# Patient Record
Sex: Male | Born: 1965 | Race: White | Hispanic: No | Marital: Married | Smoking: Never smoker
Health system: Southern US, Community
[De-identification: ages and names within clinical notes are randomized; demographics above are authoritative.]

## PROBLEM LIST (undated history)

## (undated) DIAGNOSIS — Q549 Hypospadias, unspecified: Secondary | ICD-10-CM

## (undated) DIAGNOSIS — J302 Other seasonal allergic rhinitis: Secondary | ICD-10-CM

## (undated) DIAGNOSIS — Z86711 Personal history of pulmonary embolism: Secondary | ICD-10-CM

## (undated) DIAGNOSIS — D649 Anemia, unspecified: Secondary | ICD-10-CM

## (undated) DIAGNOSIS — T8859XA Other complications of anesthesia, initial encounter: Secondary | ICD-10-CM

## (undated) DIAGNOSIS — K219 Gastro-esophageal reflux disease without esophagitis: Secondary | ICD-10-CM

## (undated) DIAGNOSIS — I10 Essential (primary) hypertension: Secondary | ICD-10-CM

## (undated) DIAGNOSIS — G43109 Migraine with aura, not intractable, without status migrainosus: Secondary | ICD-10-CM

## (undated) DIAGNOSIS — E119 Type 2 diabetes mellitus without complications: Secondary | ICD-10-CM

## (undated) DIAGNOSIS — M1711 Unilateral primary osteoarthritis, right knee: Secondary | ICD-10-CM

## (undated) DIAGNOSIS — Z87442 Personal history of urinary calculi: Secondary | ICD-10-CM

## (undated) DIAGNOSIS — IMO0002 Reserved for concepts with insufficient information to code with codable children: Secondary | ICD-10-CM

## (undated) DIAGNOSIS — E785 Hyperlipidemia, unspecified: Secondary | ICD-10-CM

## (undated) HISTORY — DX: Type 2 diabetes mellitus without complications: E11.9

## (undated) HISTORY — PX: OTHER SURGICAL HISTORY: SHX169

## (undated) HISTORY — PX: ORIF ELBOW FRACTURE: SUR928

## (undated) HISTORY — DX: Hyperlipidemia, unspecified: E78.5

## (undated) HISTORY — PX: HYPOSPADIAS CORRECTION: SHX483

## (undated) HISTORY — DX: Essential (primary) hypertension: I10

---

## 2000-08-24 ENCOUNTER — Ambulatory Visit (HOSPITAL_COMMUNITY): Admission: RE | Admit: 2000-08-24 | Discharge: 2000-08-24 | Payer: Self-pay | Admitting: Orthopedic Surgery

## 2000-08-24 ENCOUNTER — Encounter: Payer: Self-pay | Admitting: Orthopedic Surgery

## 2001-08-16 ENCOUNTER — Ambulatory Visit (HOSPITAL_COMMUNITY): Admission: RE | Admit: 2001-08-16 | Discharge: 2001-08-16 | Payer: Self-pay | Admitting: Orthopedic Surgery

## 2001-08-16 ENCOUNTER — Encounter: Payer: Self-pay | Admitting: Orthopedic Surgery

## 2002-01-06 HISTORY — PX: CERVICAL FUSION: SHX112

## 2002-08-26 ENCOUNTER — Inpatient Hospital Stay (HOSPITAL_COMMUNITY): Admission: RE | Admit: 2002-08-26 | Discharge: 2002-08-27 | Payer: Self-pay | Admitting: Neurosurgery

## 2002-08-26 ENCOUNTER — Encounter: Payer: Self-pay | Admitting: Neurosurgery

## 2003-02-25 ENCOUNTER — Emergency Department (HOSPITAL_COMMUNITY): Admission: EM | Admit: 2003-02-25 | Discharge: 2003-02-25 | Payer: Self-pay | Admitting: Emergency Medicine

## 2004-10-16 ENCOUNTER — Emergency Department: Payer: Self-pay | Admitting: Emergency Medicine

## 2005-04-27 ENCOUNTER — Other Ambulatory Visit: Payer: Self-pay

## 2005-04-27 ENCOUNTER — Emergency Department: Payer: Self-pay | Admitting: Internal Medicine

## 2006-05-27 ENCOUNTER — Emergency Department: Payer: Self-pay | Admitting: Emergency Medicine

## 2006-06-09 ENCOUNTER — Observation Stay (HOSPITAL_COMMUNITY): Admission: AD | Admit: 2006-06-09 | Discharge: 2006-06-10 | Payer: Self-pay | Admitting: Urology

## 2006-06-09 ENCOUNTER — Other Ambulatory Visit: Payer: Self-pay | Admitting: Urology

## 2006-06-24 ENCOUNTER — Inpatient Hospital Stay (HOSPITAL_COMMUNITY): Admission: EM | Admit: 2006-06-24 | Discharge: 2006-06-30 | Payer: Self-pay | Admitting: Emergency Medicine

## 2007-02-07 ENCOUNTER — Emergency Department (HOSPITAL_COMMUNITY): Admission: EM | Admit: 2007-02-07 | Discharge: 2007-02-07 | Payer: Self-pay | Admitting: Emergency Medicine

## 2007-09-16 ENCOUNTER — Ambulatory Visit (HOSPITAL_COMMUNITY): Admission: RE | Admit: 2007-09-16 | Discharge: 2007-09-16 | Payer: Self-pay | Admitting: Urology

## 2008-09-13 IMAGING — CT CT ANGIO CHEST
2 of 4 series · 19 of 36 positions shown · IV contrast (APPLIED)
Comparison: Chest x-ray of the same day.

CLINICAL DATA: Low back pain, shortness of breath, right chest pain.  Evaluate for pulmonary embolus. 
CT ANGIOGRAPHY OF CHEST:
TECHNIQUE: Multidetector CT imaging of the chest was performed during bolus injection of intravenous contrast.  Multiplanar CT angiographic image reconstructions were generated to evaluate the vascular anatomy.
Contrast:  80 cc Omnipaque 300.

[Series 6: pe 1.0 b40f thins for pacs · axial · 0.70mm/px · z∈[-264,-44]mm · 16 of 246 slices shown]
[im 13/246  lung]
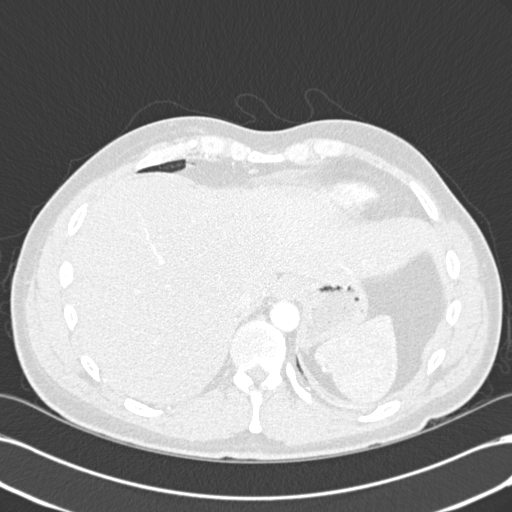
[im 25/246  mediastinal]
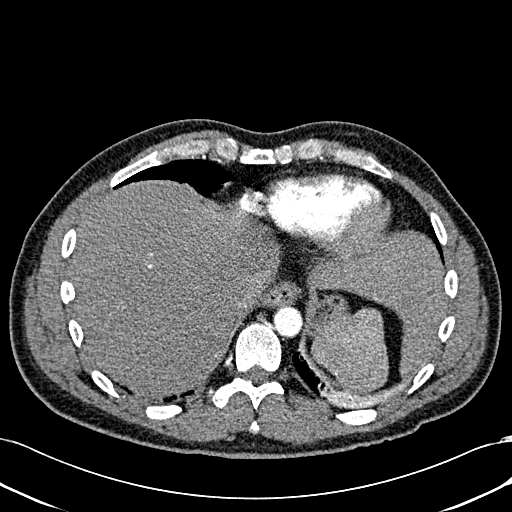
[im 37/246  lung]
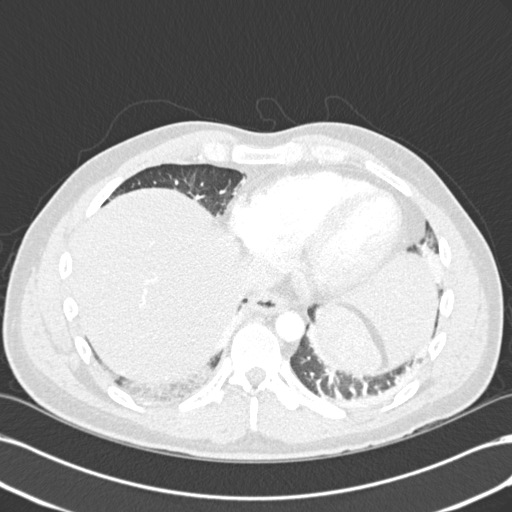
[im 62/246  mediastinal]
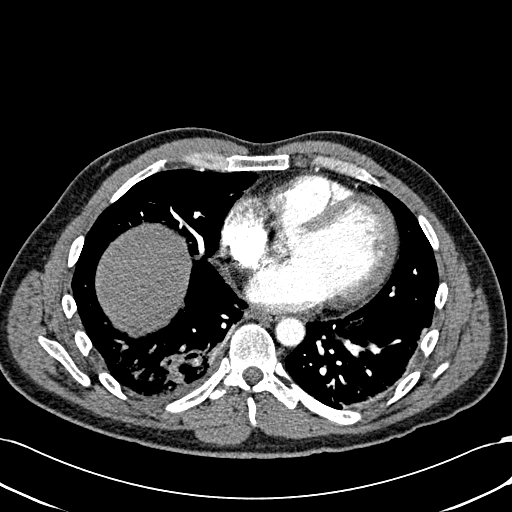
[im 74/246  lung]
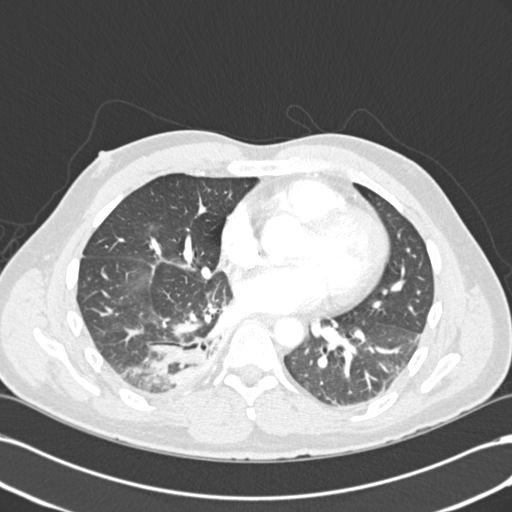
[im 86/246  mediastinal]
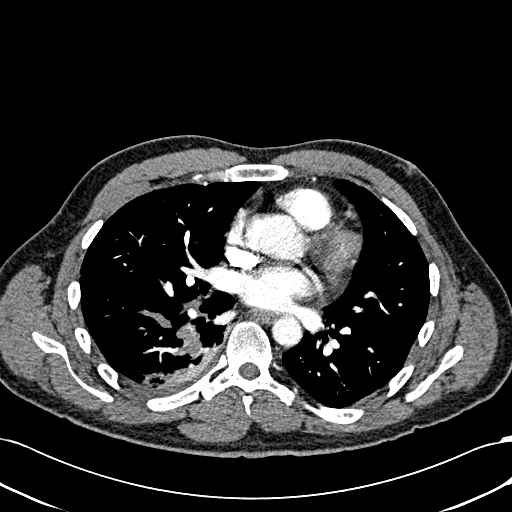
[im 99/246  lung]
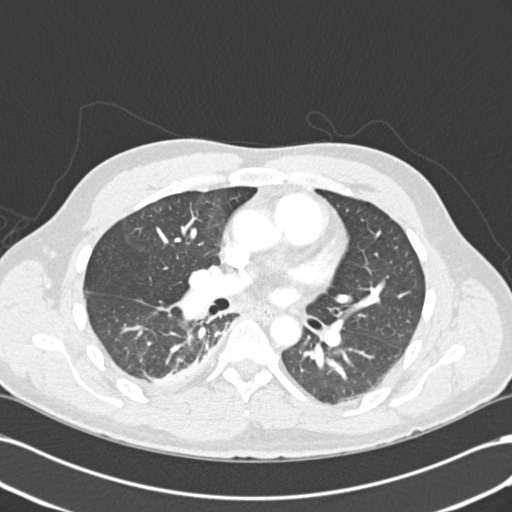
[im 111/246  mediastinal]
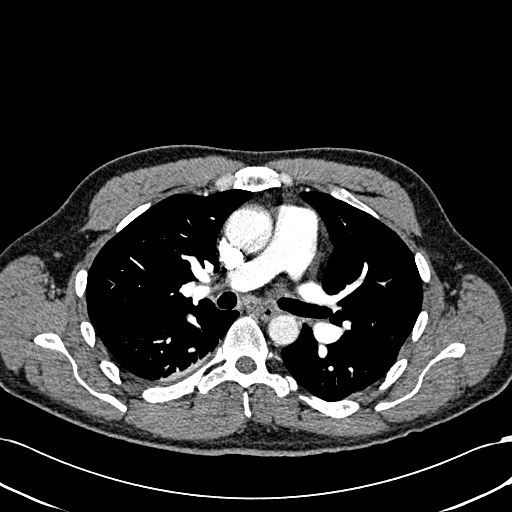
[im 135/246  lung]
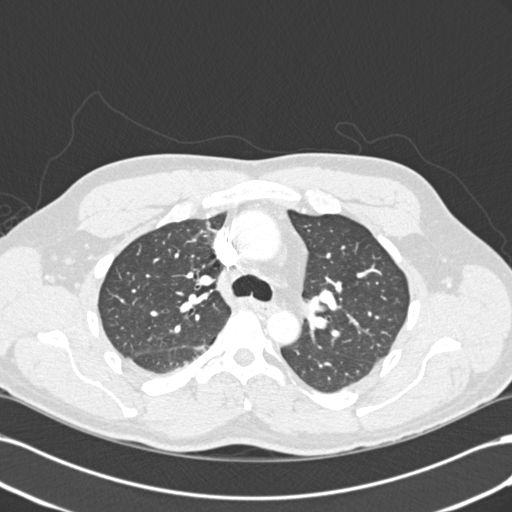
[im 148/246  mediastinal]
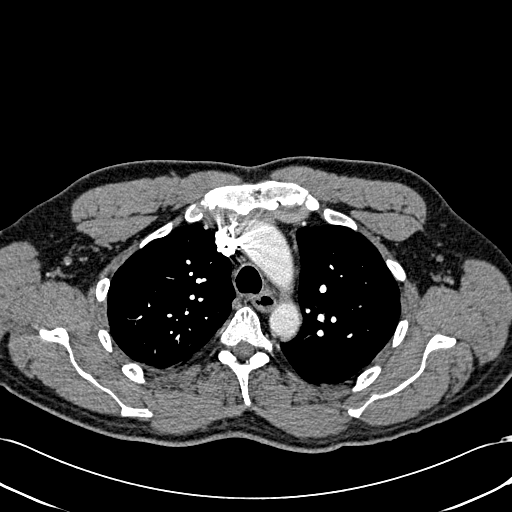
[im 160/246  lung]
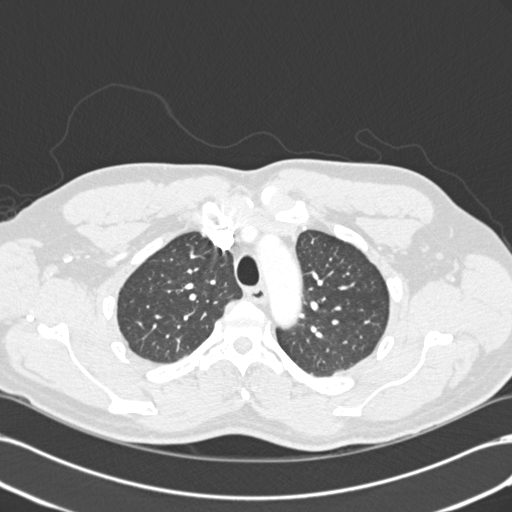
[im 172/246  mediastinal]
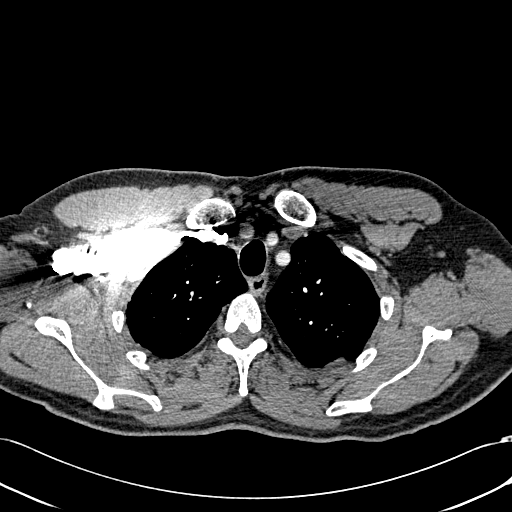
[im 184/246  lung]
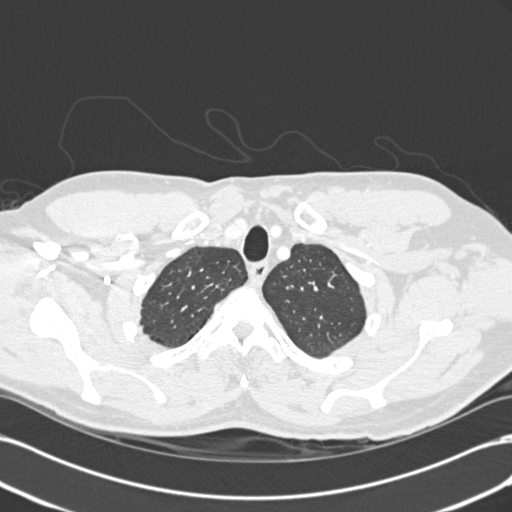
[im 209/246  mediastinal]
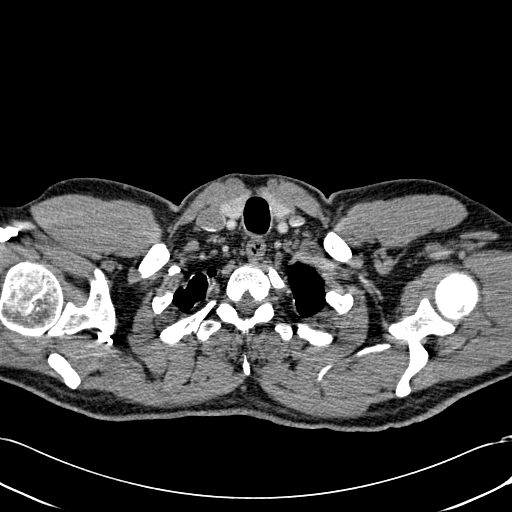
[im 221/246  lung]
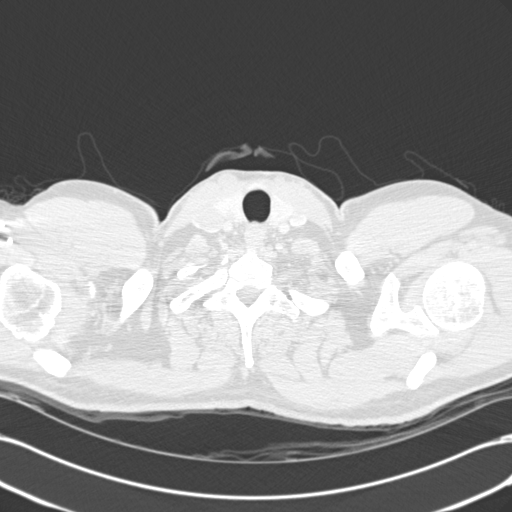
[im 233/246  mediastinal]
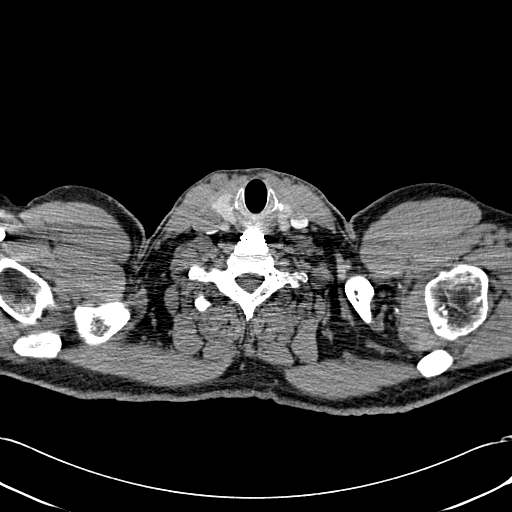

[Series 602: <mpr thick range> · coronal · 0.70mm/px · 3 of 61 slices shown]
[im 13/61  mediastinal]
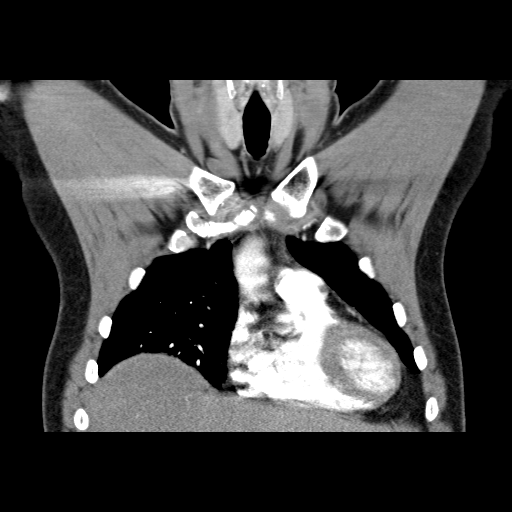
[im 25/61  mediastinal]
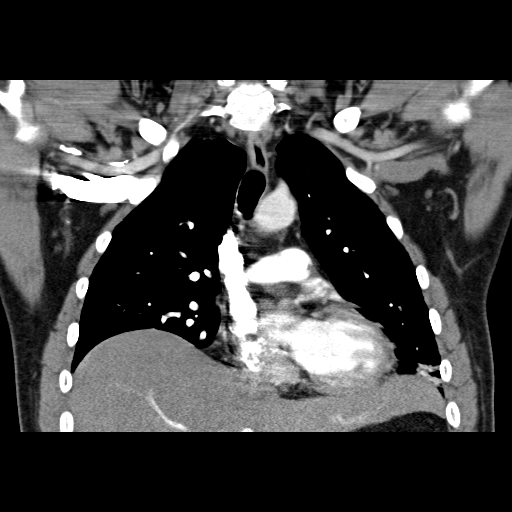
[im 37/61  mediastinal]
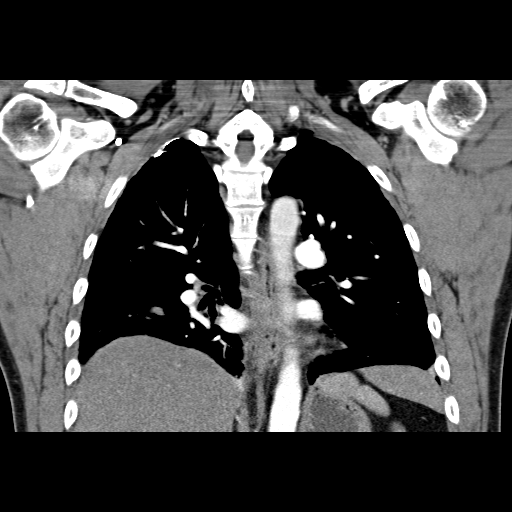

[19 of 36 positions shown; findings below may reference images not displayed]

FINDINGS: Low attenuation is seen in the right lower lobe pulmonary arteries, as proximal as the lobar level.  No evidence of right heart strain.  Heart size is at the upper limits of normal.  No pericardial fluid.  No pathologically-enlarged mediastinal, hilar, or axillary lymph nodes.  Small right thyroid nodule is nonspecific. 
Right apical scar.  Right lobar airspace disease is seen.  Left lower lobe atelectasis.  Airway unremarkable.  No pleural fluid.  
Incidental imaging of the upper abdomen is unremarkable.  No worrisome lytic or sclerotic lesions.
IMPRESSION: Right lower lobe pulmonary embolus with right lower lobe infarct.  No evidence of right heart strain.  This was discussed with Dr. Taiga on 06/24/06, at the time of interpretation.

## 2009-02-04 ENCOUNTER — Emergency Department (HOSPITAL_COMMUNITY): Admission: EM | Admit: 2009-02-04 | Discharge: 2009-02-04 | Payer: Self-pay | Admitting: Emergency Medicine

## 2009-02-06 HISTORY — PX: INGUINAL HERNIA REPAIR: SUR1180

## 2009-02-08 ENCOUNTER — Ambulatory Visit (HOSPITAL_BASED_OUTPATIENT_CLINIC_OR_DEPARTMENT_OTHER): Admission: RE | Admit: 2009-02-08 | Discharge: 2009-02-08 | Payer: Self-pay | Admitting: Urology

## 2009-03-07 ENCOUNTER — Emergency Department: Payer: Self-pay | Admitting: Emergency Medicine

## 2010-03-24 LAB — URINALYSIS, ROUTINE W REFLEX MICROSCOPIC
Bilirubin Urine: NEGATIVE
Glucose, UA: NEGATIVE mg/dL
Hgb urine dipstick: NEGATIVE
Specific Gravity, Urine: 1.007 (ref 1.005–1.030)
pH: 6 (ref 5.0–8.0)

## 2010-03-27 LAB — POCT HEMOGLOBIN-HEMACUE: Hemoglobin: 15 g/dL (ref 13.0–17.0)

## 2010-04-11 ENCOUNTER — Ambulatory Visit (HOSPITAL_BASED_OUTPATIENT_CLINIC_OR_DEPARTMENT_OTHER)
Admission: RE | Admit: 2010-04-11 | Discharge: 2010-04-11 | Disposition: A | Discharge status: Home / Self Care | Payer: BC Managed Care – PPO | Source: Ambulatory Visit | Attending: Urology | Admitting: Urology

## 2010-04-11 DIAGNOSIS — Z86711 Personal history of pulmonary embolism: Secondary | ICD-10-CM | POA: Insufficient documentation

## 2010-04-11 DIAGNOSIS — Z981 Arthrodesis status: Secondary | ICD-10-CM | POA: Insufficient documentation

## 2010-04-11 DIAGNOSIS — F29 Unspecified psychosis not due to a substance or known physiological condition: Secondary | ICD-10-CM | POA: Insufficient documentation

## 2010-04-11 DIAGNOSIS — Z01812 Encounter for preprocedural laboratory examination: Secondary | ICD-10-CM | POA: Insufficient documentation

## 2010-04-11 DIAGNOSIS — Q549 Hypospadias, unspecified: Secondary | ICD-10-CM | POA: Insufficient documentation

## 2010-04-11 LAB — POCT HEMOGLOBIN-HEMACUE: Hemoglobin: 15.1 g/dL (ref 13.0–17.0)

## 2010-05-20 NOTE — Op Note (Signed)
NAMELYDON, VANSICKLE                ACCOUNT NO.:  [id number]  MEDICAL RECORD NO.:  [id number]          PATIENT TYPE:  LOCATION:                                 FACILITY:  PHYSICIAN:  Martina Sinner, MD      DATE OF BIRTH:  DATE OF PROCEDURE:  04/11/2010 DATE OF DISCHARGE:                              OPERATIVE REPORT   PREOPERATIVE DIAGNOSIS:  Urethral stricture.  POSTOPERATIVE DIAGNOSIS:  Urethral stricture.  SURGERY:  Retrograde urethrogram plus balloon dilation of stricture.  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  I believe he has had previous urethroplasties and if I remember correctly, he actually had some distal perforation when he was dilated few years ago.  He was emptying officially, but acutely became symptomatic. Preoperative antibiotics were given.  The patient was prepped and draped in usual fashion.  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  I could not insert a well- lubricated 17 scope into the meatus.  I, therefore, inserted a well- lubricated 6-French open-ended ureteral catheter through his hypospadiac urethral meatus to the mid urethra.  I injected dye, then went freely into a moderately narrowed urethra up into the bladder.  I used this to pass a sensor wire.  Retrograde urethrogram was done with the above technique.  The patient was in lithotomy position.  The patient's penis was drawn to his left side.  Approximately 8 cc of contrast was utilized.  The dye rushed up through the prostatic urethra into the bladder, but my opinion, he had an open, but non distensible, somewhat dilated bulbar urethra.  I then passed a sensor wire curling up nicely into the bladder.  I then passed the balloon dilation catheter to the level of the external sphincter and it was still exiting meatus approximately 1 cm.  I dilated to 18 atmospheres of pressure for 5 minutes.  I removed the balloon dilation catheter after deflating it  and there was no blood on it.  I then cystoscoped with a 17-French scope along the wire up through the urethra in the bladder.  With the penis on stretch, I came back through a normal prostatic urethra and sphincter.  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  There was some fraying of tissue not surprisingly.  He was oozing a little bit, it was clear he needed a catheter.  With some difficulty, I passed a 16-French Council-tip catheter well lubricated over the wire.  I was actually surprised that there was a fair amount of resistance, but it went in nicely.  He had a quick cystogram to make certain that the catheter was in the bladder and that was.  I dilated the balloon and the catheter was laying in place and draining well.  The patient's catheter will come out on Saturday morning.  Mr. Quint's case went very well.          ______________________________ Martina Sinner, MD     SAM/MEDQ  D:  04/11/2010  T:  04/12/2010  Job:  [id number]  Electronically Signed by Alfredo Martinez MD on 05/20/2010 01:15:31 PM

## 2010-05-21 NOTE — Op Note (Signed)
[name], [name]                ACCOUNT NO.:  [id number]   MEDICAL RECORD NO.:  [id number]          PATIENT TYPE:  AMB   LOCATION:  [facility]                         FACILITY:  [facility]   PHYSICIAN:  Martina Sinner, MD DATE OF BIRTH:  06-03-1965   DATE OF PROCEDURE:  06/09/2006  DATE OF DISCHARGE:                               OPERATIVE REPORT   SURGEON:  Martina Sinner, MD   ASSISTANT:  Terie Purser, MD.   PREOP DIAGNOSIS:  1. Urethral stricture disease.  2. Urethrocutaneous fistula.   POSTOPERATIVE DIAGNOSES:  1. Panurethral stricture disease.  2. Urethrocutaneous fistula.  3. Mild urethral perforation.   SURGERY:  1. Cystoscopy.  2. Balloon dilation of urethral stricture under fluoroscopic guidance.   HISTORY:  [name]'s history has been previously well  documented.  He has had a hypospadias repair and a urethrocutaneous  fistula.  We documented the position of his urethrocutaneous fistula  preoperatively.   DESCRIPTION OF PROCEDURE:  The patient is prepped and draped in the  usual fashion.  He had taken Proquin prior to surgery for 3 days.  He  was given IV ciprofloxacin.  The patient was prepped and draped in the  usual fashion.  Extra care was taken to minimize a risk compartment  syndrome and neuropathy. He did have a positive urine culture dated May  27 which was sensitive to ciprofloxacin.  It was presumed enterococcus.  I felt that it was due to colonization and he had taken Proquin for 3  days prior to the procedure.   Interestingly, he had three markings on the glans penis and he does  report having three urethral orifices in the past.  His urethral meatus  was quite distal, but there is no question that there was skin as  opposed to a true urethra.  I could look inside the orifice and see a  scar.  I gently placed a 17-French scope just through the meatus; and  could see a stricture of approximately 12-French.  I did not try to  scope through  this.   We gently placed a sensor wire well lubricated under fluoroscopic  guidance easily into the bladder.  I then placed the balloon dilation  catheter over the sensor wire and could see fluoroscopically its tip  near the membranous urethra.  It extended all the way to the urethral  orifice.  I want to make certain it was distal left to dilate the  urethra distally that was obviously scarred.   Dye was inserted and I filled the balloon to 18 atmospheres of pressure  under fluoroscopic guidance.  The dilation was set for 5 minutes.  I  then removed the balloon after emptying it.  I removed it; left the wire  in place; and cystoscoped the patient with a 17-French scope.  It was  easy to pass the scope through the urethra along the wire into the  bladder.  For this reason I removed the sensor wire; and had a good look  inside his bladder.  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  He had a number of saccules.  He  had saccules large enough almost to be called small diverticula  especially posteriorly.  He had a capped off, suprapubic tube near the  dome identified.  There were no other mucosal abnormalities.   I then examined the urethra.  He had a long prostatic urethra and the  verumontanum and external sphincter were easily identified.  His urethra  looked normal for 1 cm or less distal to the membranous urethra; and  then he was noted to have panurethral stricture disease dilated to  approximately 24-French throughout the entire length.  I kept the penis  on stretch as I brought back the scope within the lumen; and there was  no question that he had a good dilation, but stricture disease  throughout.  With one look I could not separate what, I thought, was  scarred urethra from penile skin.  There was no hair.  The dilation was  uniform throughout.  As the scope exited the meatus, he had some  subcutaneous penile edema for  approximately 3 cm x 1.5 cm distally and  ventrally.   My clinical diagnosis was that the urethra, which actually looked quite  good after the dilation and was not bleeding, must have perforated  distally due to the unhealthy nature of the tissue itself. I cystoscoped  for a few seconds, again, to make sure that the urethra looked fine; and  I could see within the urethra; and, again, I thought that dilation was  minimally traumatic.  I easily placed a 14-French catheter into the  bladder; and it was draining well.  I then firmly pressed on the urethra  circumferentially to try to compress any edema.  The penile shaft looked  almost normal after doing this for a few moments.   One could not see fluid coming out through the small fistula on the  right side two-thirds along the length from the penoscrotal junction  towards the corona.   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  In spite of a gentle balloon dilation,  somewhere along the penile urethra, he has had a perforation.  I am  going to keep the catheter in until next Friday which is approximately  10 days.  I am going to do a pericatheter urethrogram.  I am going to  keep him on ciprofloxacin.  I am going to see him in the office in 2  days and keep a close eye on him by phone to make certain that he does  not get a soft tissue infection.  He is allergic to PENICILLIN, so I did  not have any Keflex.  I left a suprapubic in place because of the soft  tissue findings.  Otherwise I would remove the suprapubic tube since  dilation went so well.   Before seeing the soft tissue edema I would have planned to leave his  catheter in for 3 days.  If his stricture had been shorter, he could  have been left catheter-free or a catheter over night based upon how  nicely the dilation went i.e. it was atraumatic.  I will do a pericatheter retrograde a week from Friday.  I am hoping   that [name] will notice that his flow is a lot better; and  potentially he can live with the fistula; although it could be repaired  in the future.  I do not think that  he will want a full length,  multistage urethral reconstructive procedure.  Perhaps we could keep him  on self dilation with a catheter plus-or-minus fistula repair.           ______________________________  Martina Sinner, MD  Electronically Signed     SAM/MEDQ  D:  06/09/2006  T:  06/09/2006  Job:  [id number]

## 2010-05-21 NOTE — Op Note (Signed)
NAMEFAYSAL, [name]                ACCOUNT NO.:  [id number]   MEDICAL RECORD NO.:  [id number]          PATIENT TYPE:  AMB   LOCATION:  DAY                          FACILITY:  [facility]   PHYSICIAN:  Martina Sinner, MD DATE OF BIRTH:  19-Aug-1965   DATE OF PROCEDURE:  09/16/2007  DATE OF DISCHARGE:                               OPERATIVE REPORT   PREOPERATIVE DIAGNOSIS:  History of hypospadias with recurrent urethral  stricture.   POSTOPERATIVE DIAGNOSIS:  History of hypospadias with recurrent urethral  stricture.   PROCEDURE:  1. Cystourethroscopy.  2. Balloon dilation of urethral stricture.  3. Placement of 16 French Council tip catheter over a wire.   ATTENDING PHYSICIAN:  Martina Sinner, MD.   RESIDENT PHYSICIAN:  Dr. Delman Kitten.   ANESTHESIA:  General.   INDICATIONS FOR PROCEDURE:  [name] is a 45 year old white male with  past medical history positive for hypospadias.  This was repaired  multiple times and he has had a full length graft repair as well.  He  has developed a urethral perforation in the past after balloon dilation  as well as urethrocutaneous fistula.  He is having persistent difficulty  urinating and likewise all risks, benefits, consequences and concerns  were discussed preoperatively with him along with the operative plan and  informed consent was obtained.   PROCEDURE IN DETAIL:  The patient was brought to the operating room and  placed in supine position.  He was correctly identified by his  wristband.  Appropriate time-out was taken.  IV antibiotics were  administered and general anesthesia was delivered.  Once adequately  anesthetized he was placed in dorsal lithotomy position.  Great care  taken to minimize risk of peripheral neuropathy or compartment syndrome.  His perineum was prepped and draped sterilely.  We began our procedure  by performing rigid urethroscopy with a 17 French rigid cystoscope and  sterile water as our irrigant and 12  degree lens.  We were able to  cannulate his hypospadiac urethral meatus.  However, advancement of the  scope only proceeded approximately 1.5 cm when we encountered  circumferential narrowing of his urethra.  Here we found two lumens, one  at 6 o'clock and one at 12 o'clock.  It appeared as if the 12 o'clock  lumen was his true lumen.  We then passed a cystic guidewire through the  scope into the true lumen and using fluoroscopic guidance advanced it  without resistance up into the bladder.  We then removed the cystoscope  and placed a Cook urethral balloon dilator over the guidewire and placed  it appropriately distal to the urethral sphincter.  We then inflated the  balloon with radiopaque contrast to 17 mmHg and left it in place for 5  minutes.  The balloon was let down and removed leaving the guidewire in  place.  We then performed cystourethroscopy.  We advanced the rigid 17  French rigid cystoscope through his now widely patent urethra without  any significant resistance into the bladder.  He had mild trabeculation  of his bladder with small saccules on either  side of the posterior wall  of his bladder.  Both ureteral orifices were noted to be in their normal  anatomic position effluxing clear urine.  There were no foreign bodies  or stones seen.  The scope was then removed in an antegrade fashion.  Close inspection of the urethra demonstrated that his stricture disease  began approximately 1-2 cm distal to the external sphincter and  proceeded through the entire length of the urethra.  No evidence of  perforation or fistula was seen.  We then placed a 16 [place] Council tip  catheter over the remaining guidewire, inflated it with 10 mL of sterile  water and this marked the end of our procedure.  He tolerated the  procedure well.  There were no complications.  He awoke from anesthesia  and was taken to the recovery room in stable condition.  Dr. Sherron Monday  was present and  participated in all aspects of the case.     ______________________________  Dr Delman Kitten      Martina Sinner, MD  Electronically Signed    DW/MEDQ  D:  09/16/2007  T:  09/17/2007  Job:  [id number]

## 2010-05-21 NOTE — Discharge Summary (Signed)
[name], [name]                ACCOUNT NO.:  [id number]   MEDICAL RECORD NO.:  [id number]          PATIENT TYPE:  INP   LOCATION:  1401                         [facility]ACILITY[facility]   PHYSICIAN:  Altha Harm, MDDATE OF BIRTH:  1965-08-12   DATE OF ADMISSION:  06/24/2006  DATE OF DISCHARGE:                               DISCHARGE SUMMARY   INTERIM DISCHARGE SUMMARY   DATE OF DISCHARGE:  Not yet determined.   CURRENT DIAGNOSES:  1. Right lower lobe pulmonary embolus.  2. Leukocytosis, resolved.  3. Nausea and vomiting, resolved.  4. History of a urinary tract infection with pyelonephritis.  5. History of migraine headaches.   DISCHARGE MEDICATIONS:  Discharge medications will be determined at the  time of discharge.   CONSULTATIONS:  None.   PROCEDURE:  None.   DIAGNOSTIC STUDIES:  1. Portable one view chest x-ray that showed markedly diminished lung      volumes with increased bibasilar air space disease, likely      atelectasis.  No pneumothorax.  2. Chest x-ray, two view, which shows bibasilar atelectasis.  3. CT scan angiogram of chest which showed right lower lobe pulmonary      embolus with right lower lobe infarct.   CODE STATUS:  Full code.   ALLERGIES:  PENICILLIN.   PRIMARY CARE PHYSICIAN:  Dr. Loma Sender, [place], [place].   CHIEF COMPLAINT:  Right sided pleuritic chest pain.   HISTORY OF PRESENT ILLNESS:  Please see the dictated H and P for details  of the HPI.   HOSPITAL COURSE:  PROBLEM #1:  The patient was admitted and found to  have a pulmonary embolus with pulmonary infarct.  The patient was  started on full dose heparin and started on Coumadin.  He was  continued/maintained in an anticoagulated state with unfractionated  heparin and his Coumadin titrated.  Several attempts were made to  instruct the patient in Lovenox administration, however, the patient  maintained that he was unable to tolerate Lovenox shots due to  severe  vasovagal reaction to shots and thus the patient was titrated on  Coumadin while being maintained on unfractionated heparin.  The patient  still remains sub therapeutic at this time and he will continue his  titration until we get him in a therapeutic state.  I anticipate the  patient will need to remain on his anticoagulation for a total of 6  months at the least.  Several studies have been sent for hypercoagulable  panel on this patient, however, the results are still pending, except  for the homocysteine level which was found to be within normal limits.   PROBLEM #2:  LEUKOCYTOSIS:  This was felt to be secondary to the  pulmonary embolus and resolved spontaneously.   PROBLEM #3:  NAUSEA AND VOMITING:  This is felt to be secondary to  vasovagal response to IV placement and this resolved spontaneously also.   Currently the patient is in a stable state and is ready for discharge if  and when he can administer the Lovenox shots or become therapeutic on  his Coumadin.  The dose of Coumadin is to be determined by the  discharging physician at the time of discharge.      Altha Harm, MD  Electronically Signed     MAM/MEDQ  D:  06/29/2006  T:  06/30/2006  Job:  [id number]   cc:   Loma Sender  Fax: [phone]

## 2010-05-21 NOTE — Consult Note (Signed)
[name], [name]                ACCOUNT NO.:  [id number]   MEDICAL RECORD NO.:  [id number]          PATIENT TYPE:  INP   LOCATION:  1401                         FACILITY:  [facility]   PHYSICIAN:  Marcellus [name], MD     DATE OF BIRTH:  Oct 14, 1965   DATE OF CONSULTATION:  DATE OF DISCHARGE:  06/30/2006                                 CONSULTATION   ADDENDUM:  This is an addendum to the DISCHARGE SUMMARY that was done by  Dr. Jerolyn Center on June 29, 2006, which will update the discharge  medications.   PRIMARY CARE PHYSICIAN:  Dr. Loma Sender in [place], [place], phone #[phone].   Since June 29, 2006, the patient has continued to remain stable with  minimal right-sided pleuritic chest pain which has significantly  improved since admission.  No further headaches.  The patient initially  was resistant to taking subcutaneous Lovenox bridging; however, today  the patient is hesitantly agreeable to taking the Lovenox subcutaneously  which will be administered by his mother, who will be educated prior to  the patient's discharge.  Patients apprehension is for the needle  sticks.   DISCHARGE MEDICATIONS:  1. bridging Lovenox 120 mg subcutaneously once daily, which is to be      continued for 48 hours after the INR is therapeutic, between 2 and      3, and then discontinue.  Again, I have provided a prescription to      last him for three days.  2. Coumadin 10 mg p.o. daily.  Further dosing to be adjusted by the      PMD, based on INR checks.  I have provided him a prescription to      last him for a total of three days.  3. Vicodin 5/325 mg, one p.o. q.6-8h. p.r.n.   FOLLOWUP:  He is to follow up with his primary medical doctor on  Thursday, July 02, 2006, with a repeat PT and INR.  Following the review  of those results, the Coumadin dosage and decision to continue Lovenox  is to be made.  The patient has been instructed to seek immediate  medical attention if there is  any deterioration in his condition or  bleeding.      Marcellus [name], MD  Electronically Signed     AH/MEDQ  D:  06/30/2006  T:  06/30/2006  Job:  [id number]   cc:   Loma Sender  Fax: [phone]   Martina Sinner, MD  Fax: [phone]

## 2010-05-21 NOTE — H&P (Signed)
[name], SGRO NO.:  [id number]   MEDICAL RECORD NO.:  [id number]          PATIENT TYPE:  EMS   LOCATION:  ED                           FACILITY:  [facility]   PHYSICIAN:  [name] [name], MD     DATE OF BIRTH:  1965/12/11   DATE OF ADMISSION:  06/24/2006  DATE OF DISCHARGE:                              HISTORY & PHYSICAL   PRIMARY MEDICAL DOCTOR:  [place]. Loma Sender in [place] and hence the patient is unassigned to the [employer].   UROLOGIST:  Dr. Lorin Picket MacDiarmid of [facility] in [place].   CHIEF COMPLAINT:  Right-sided pleuritic chest pain.   HISTORY OF PRESENT ILLNESS:  [name] [name] is a pleasant 45 year old  Caucasian male patient with no significant past medical history.  He had  cystoscopy and balloon dilatation of a urethral stricture by urology  followed by placement of a Foley and suprapubic catheter approximately 2  weeks ago.  His Foley catheter was taken out 4-5 days ago and his  suprapubic catheter was removed 2 days ago.  Two nights ago the patient  noticed right-sided lower chest (infra-axillary) dull aching  intermittent chest pain.  However, by last night the pain had gotten to  severe to 8/10, nonradiating, pleuritic in nature, with associated  difficulty in taking a deep breath with associated nausea and an episode  of dizziness.  For these complaints, the patient presented to the  emergency room where he was noted to have a positive D-dimer which was  followed up with a CT angiogram of the chest which revealed  right-sided  pulmonary embolus with pulmonary infarction.  The patient was started on  intravenous unfractionated heparin drip and we were asked to admit the  patient.   PAST MEDICAL HISTORY:  1. Urinary tract infection/pyelonephritis.  2. Migraine.   PAST SURGICAL HISTORY:  1. Cervical spine fusion in 2004 which was fixed with two plates and      eight screws via the anterior  cervical route.  2. 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 Bilateral upper extremity fracture secondary to fall off a horse.   ALLERGIES:  No known drug allergies.   MEDICATIONS:  1. Maxalt 10 mg p.o. p.r.n.  2. Cipro 500 mg p.o. daily for 3 days and today is the last day.   FAMILY HISTORY:  The patient's mother with history of coronary stent at  age 70.   SOCIAL HISTORY:  The patient is married.  Is a Teaching laboratory technician.  Social alcohol intake.  No history of smoking or drug abuse.   REVIEW OF SYSTEMS:  Over 10 systems reviewed and is pertinent for:  1. Headache.  2. Constipation.  Last BM 2-3 days ago.   There is no dysuria, hematuria, fever, chills or rigors.   PHYSICAL EXAMINATION:  GENERAL:  [name] [name] is a moderately-built and -  nourished male patient in no obvious respiratory distress but has  intermittent painful distress on taking deep breaths.  VITAL SIGNS:  Temperature 97.4 degrees Fahrenheit, blood pressure  100/60, pulse is 84 per minute, respirations 20 per minute, saturating  at 95% on room air.  HEAD, EYE, ENT:  Nontraumatic, normocephalic.  Pupils equally reacting  to light and accommodation.  No oropharyngeal erythema.  NECK:  No JVD, carotid bruit, lymphadenopathy or goiter.  Supple.  Surgical scar of the left anterior neck.  LYMPH:  no lymphadenopathy.  RESPIRATORY:  Slightly decreased breath sounds in the right infra-  axillary region.  The rest of the lung fields are clear.  No wheezing,  rhonchi, crackles or rubs.  CARDIOVASCULAR:  First and second heart sounds are heard.  No third or  fourth heart sounds.  No murmurs, rubs, gallops or clicks.  ABDOMEN:  Nondistended, nontender.  No organomegaly or mass.  Bowel  sounds normally heard.  CENTRAL NERVOUS SYSTEM:  The patient is awake, alert and oriented x3  with no focal neurological deficits.  EXTREMITIES:   No cyanosis, clubbing or edema.  Peripheral pulses are  symmetrically felt.  SKIN:  Without any rashes.   LABORATORIES:  Point-of-care cardiac markers are negative.  Basic  metabolic panel is unremarkable with a BUN of 15, creatinine of 1.17.  D-  dimer of 1.89.  CBC remarkable for a hemoglobin of 14.1, hematocrit  40.1, platelets 245, and white blood cell of 19.1.   CT angiogram of the chest, impression:  Right lower lobe pulmonary  embolus with right lower lobe infarct.  No evidence of right heart  strain.  Chest x-ray, impression:  Bibasilar atelectasis.   EKG is normal sinus rhythm at 87 beats per minute.  No acute changes.   ASSESSMENT AND PLAN:  1. Acute right lower lobe pulmonary embolus with pulmonary infarct.      The patient is hemodynamically stable and maintaining adequate      oxygen saturations.  Will admit the patient to telemetry.  The      patient is to be continued on the IV unfractionated heparin drip.      Will place the patient on Coumadin per pharmacy.  Will continue      oxygen via nasal cannula and provide pain medications.  2. Leukocytosis is probably secondary to problem #1 and associated      stress.  Will follow CBC and urinalysis.  3. The patient is status post cystoscopy and balloon dilatation of      urethral stricture.  To complete his course of Cipro.      [name] [name], MD  Electronically Signed     AH/MEDQ  D:  06/24/2006  T:  06/24/2006  Job:  [id number]   cc:   Martina Sinner, MD  Fax: [phone]   Loma Sender  Fax: [phone]

## 2010-05-24 NOTE — Op Note (Signed)
NAMEAVONDRE, RICHENS                          ACCOUNT NO.:  [id number]   MEDICAL RECORD NO.:  [id number]                   PATIENT TYPE:  INP   LOCATION:  2892                                 FACILITY:  [facility]   PHYSICIAN:  Coletta Memos, M.D.                  DATE OF BIRTH:  1965-10-12   DATE OF PROCEDURE:  08/26/2002  DATE OF DISCHARGE:                                 OPERATIVE REPORT   PREOPERATIVE DIAGNOSES:  1. 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  2. Cervical radiculopathy.  3. Cervical displaced disks, C4-5, C5-6, C6-7.   POSTOPERATIVE DIAGNOSES:  1. 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  2. Cervical radiculopathy.  3. Cervical displaced disks, C4-5, C5-6, C6-7.   PROCEDURE:  1. Anterior cervical decompression, C4-5, C5-6, C6-7.  2. Anterior instrumentation C4 to C7.  3. Arthrodesis C4 to C7 with 3 allografts 7 mm at C5-6, C6-7 and 8 mm at C4-     5. Synthes small stature plate used.   COMPLICATIONS:  None.   SURGEON:  Coletta Memos, M.D.   ASSISTANT:  Hilda Lias, M.D.   ANESTHESIA:  General endotracheal anesthesia.   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  An MRI showed severe stenosis at C4-5, 5-6 and 6-7 and displaced disks at  all 3 levels. I therefore  recommended and he agreed to undergo  decompression and subsequent arthrodesis.   DESCRIPTION OF PROCEDURE:  Mr. Nong was brought to the operating room,  intubated and placed under general anesthesia without difficulty. His head  was placed in essentially a neutral position on a horseshoe head rest and 10  pounds was applied via chin strap. His neck was prepped and he was draped in  a sterile fashion.   I infiltrated 5 mL of 0.5% Lidocaine 1:200,000 strength epinephrine and a  longitudinal incision mirroring the medial border of the left  sternocleidomastoid. I opened the  skin with a #10 blade and took this down  to the platysma. I then opened the platysma, also along the anterior border  of the sternocleidomastoid. I then developed an avascular plane to the  cervical spine. I placed a spinal needle and identified C6-7.   I then reflected the longus coli muscles from C4 to C7. I first started at  C6-7, placed the self-retaining retractors and 2 distracting pins, 1 at C6  and 1 at C7. Using the high-speed drill with the microscope for microscopic  dissection, Kerrison punches and pituitary rongeurs, I was able to open the  disk space and remove large osteophytes on both the right and left sides. I  did significant decompression on the left side, as that is where he was  having arm pain, to free up the C7 nerve root. Once that was done,  I placed  a  7-mm piece of bone in that space.   I then removed the distraction pin at C7 and placed it in C5. I also moved  the self-retaining retractors. I opened the disk space with a #15 blade.  Then in the same fashion I distracted the disk space and then used pituitary  rongeurs, the high-speed drill and Kerrison punches and also removed large  osteophytes in C5-6 and decompressed both nerve roots. Again I was far more  aggressive on the left than the right, as that was the side he was  symptomatic, although both nerve roots were well decompressed  and the  neuroforamina were open on both sides.   I then removed the distraction pin at C6 and placed it C4. I opened the disk  space at C4, and again using the  same technique, decompressed both C5 nerve  roots using Kerrison punches and a high-speed drill. Here the main  contention was to remove a large midline disk, and that was achieved without  great  difficulty. I did decompress the neuroforamen, although not as  aggressively as at the other 2 levels.   Dr. Jeral Fruit at that time then assisted with placing the anterior plate after  removing the distraction pins. We were  able to size the plate and then  placed 1 screw at C4, 2 at C5, 2 at C6, 2 at C7. The top right screw at C4  did not have good purchase. I removed it. I felt that there were more than  enough  points of fixation to do without that screw. Each screw was placed  first by drilling, tapping and placing the screw. A resting screw was  attempted at the top right of C4, but it also did not achieve good purchase.  I then  irrigated the wound. Locking screws were placed at all levels.   A small stature  plate Synthes plate was used. X-ray showed the plate to be  in good position. I then reapproximated the wound, first by reapproximating  the platysma and then the subcutaneous tissues. Dermabond was used for a  sterile dressing.   The patient tolerated the procedure well. He was extubated and moving all  extremities.                                               Coletta Memos, M.D.    KC/MEDQ  D:  08/26/2002  T:  08/27/2002  Job:  [id number]

## 2010-10-23 LAB — BASIC METABOLIC PANEL
BUN: 11
BUN: 14
BUN: 15
CO2: 26
Calcium: 9.1
Chloride: 103
Creatinine, Ser: 1
GFR calc Af Amer: >60
GFR calc non Af Amer: >60
GFR calc non Af Amer: >60
Glucose, Bld: 146 — ABNORMAL HIGH
Glucose, Bld: 152 — ABNORMAL HIGH
Potassium: 3.9
Potassium: 3.9
Potassium: 4.2
Sodium: 135
Sodium: 137

## 2010-10-23 LAB — URINALYSIS, ROUTINE W REFLEX MICROSCOPIC
Bilirubin Urine: NEGATIVE
Ketones, ur: NEGATIVE
Nitrite: NEGATIVE
Specific Gravity, Urine: >1.046 — ABNORMAL HIGH
Urobilinogen, UA: 0.2

## 2010-10-23 LAB — CBC
HCT: 35.3 — ABNORMAL LOW
HCT: 36.9 — ABNORMAL LOW
HCT: 40.1
Hemoglobin: 12 — ABNORMAL LOW
Hemoglobin: 12.1 — ABNORMAL LOW
Hemoglobin: 12.2 — ABNORMAL LOW
Hemoglobin: 12.9 — ABNORMAL LOW
Hemoglobin: 13
Hemoglobin: 14.1
MCHC: 34.2
MCHC: 34.5
MCHC: 34.6
MCHC: 34.7
MCHC: 35.1
MCHC: 35.2
MCV: 86
MCV: 86
MCV: 86.4
MCV: 86.6
MCV: 86.6
MCV: 86.7
MCV: 87.2
Platelets: 245
RBC: 4.02 — ABNORMAL LOW
RBC: 4.04 — ABNORMAL LOW
RBC: 4.05 — ABNORMAL LOW
RBC: 4.25
RBC: 4.66
RDW: 13.6
RDW: 13.7
RDW: 13.7
RDW: 13.8
RDW: 13.9
RDW: 14.1 — ABNORMAL HIGH
WBC: 19.1 — ABNORMAL HIGH

## 2010-10-23 LAB — POCT CARDIAC MARKERS
CKMB, poc: 1.4
Troponin i, poc: <0.05

## 2010-10-23 LAB — LUPUS ANTICOAGULANT PANEL
PTTLA Confirmation: 17.6 — ABNORMAL HIGH (ref <8.0)
dRVVT Incubated 1:1 Mix: 41 (ref 36.1–47.0)

## 2010-10-23 LAB — D-DIMER, QUANTITATIVE: D-Dimer, Quant: 1.89 — ABNORMAL HIGH

## 2010-10-23 LAB — FACTOR 5 LEIDEN

## 2010-10-23 LAB — DIFFERENTIAL
Eosinophils Absolute: 0
Eosinophils Relative: 0
Lymphocytes Relative: 7 — ABNORMAL LOW
Lymphs Abs: 1.4
Monocytes Relative: 3

## 2010-10-23 LAB — CARDIOLIPIN ANTIBODIES, IGG, IGM, IGA: Anticardiolipin IgA: <7 — ABNORMAL LOW (ref <13)

## 2010-10-23 LAB — HEPARIN LEVEL (UNFRACTIONATED)
Heparin Unfractionated: 0.42
Heparin Unfractionated: 0.46
Heparin Unfractionated: 0.55

## 2010-10-23 LAB — PROTIME-INR: Prothrombin Time: 18.3 — ABNORMAL HIGH

## 2010-10-24 LAB — POCT HEMOGLOBIN-HEMACUE: Hemoglobin: 13.7

## 2010-12-03 ENCOUNTER — Other Ambulatory Visit: Payer: Self-pay | Admitting: Urology

## 2010-12-03 ENCOUNTER — Encounter (HOSPITAL_BASED_OUTPATIENT_CLINIC_OR_DEPARTMENT_OTHER): Payer: Self-pay | Admitting: *Deleted

## 2010-12-03 NOTE — Progress Notes (Signed)
NPO AFTER MN. PT TO ARRIVE AT 1000. NEEDS HG. ASK MDA , ?EKG.

## 2010-12-04 NOTE — H&P (Signed)
History of Present Illness   [name] has complicated urethral stricture disease. He has a small opening proximal to the neomeatus from his hypospadias repair and he dribbles a small amount sometimes. In the last week or so his flow has gotten a lot slower. He is actually spraying on himself a little bit since the flow is weak. He is not particularly straining. He says it feels like he even passed something like a small ball but it was hard to sort this out.  I read his last dictation from his dilation in April and he had pan urethral stricture disease. He has had a distal perforation in the past.  There is no other modifying factors or associated signs or symptoms. There is no other aggravating or relieving factors. The symptoms are moderate in severity and persistent.  Urinalysis: I reviewed, negative.  He was having some burning 2 weeks ago and was called in ciprofloxacin.      Past Medical History Problems  1. History of  Arthritis V13.4  Surgical History Problems  1. 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 History of  Cystoscopy For Urethral Stricture 3. History of  Cystoscopy For Urethral Stricture 4. History of  Cystoscopy For Urethral Stricture 5. History of  Neck Surgery 6. History of  Surgery Penis Repair Of Hypospadias Complications  Current Meds 1. Cipro TABS; Therapy: (Recorded:16Nov2012) to 2. Maxalt TABS; Therapy: (Recorded:21May2008) to  Allergies Medication  1. No Known Drug Allergies  Family History Problems  1. Family history of  Family Health Status Number Of Children 1 DAUGHTER 2. Family history of  Hematuria 3. Family history of  Renal Failure  Social History Problems  1. Alcohol Use 0.7 PPD 2. Caffeine Use 5 PPD 3. Marital History - Currently Married 4. Never A Smoker Denied  5. Tobacco Use  Vitals Vital Signs [Data Includes: Last 1 Day]  16Nov2012 09:07AM  Blood Pressure: 131 / 86 Temperature: 97.3 F Heart Rate:  62  Results/Data Urine [Data Includes: Last 1 Day]  16Nov2012  COLOR: YELLOW  Reference Range YELLOW APPEARANCE: CLEAR  Reference Range CLEAR SPECIFIC GRAVITY: 1.020  Reference Range 1.005-1.030 pH: 5.5  Reference Range 5.0-8.0 GLUCOSE: NEG mg/dL Reference Range NEG BILIRUBIN: NEG  Reference Range NEG KETONE: NEG mg/dL Reference Range NEG BLOOD: NEG  Reference Range NEG PROTEIN: NEG mg/dL Reference Range NEG UROBILINOGEN: 0.2 mg/dL Reference Range 1.6-1.0 NITRITE: NEG  Reference Range NEG LEUKOCYTE ESTERASE: NEG  Reference Range NEG  Assessment Assessed  1. Urethral Stricture 598.9 2. Urinary Stream Is Smaller 788.62 3. Dysuria 788.1  Plan Health Maintenance (V70.0)  1. UA With REFLEX  Done: 16Nov2012 08:57AM  Discussion/Summary   [name] in my opinion should have another cystoscopy retrograde urethrogram and balloon dilation on a semi-acute basis.   I will then read his entire chart and we will discuss future urethroplasty options again. He is very nervous and does not want to pass a catheter.   He is very familiar with balloon dilation.   We talked about balloon dilation in detail. Pros, cons, general surgical and anesthetic risks, and other options including watchful waiting were discussed. He understands that dilation is generally successful in most cases but long-term success rates are low. We talked about the risk of persistent, de novo, or worsening incontinence and voiding dysfunction. Risks were described but not limited to the discussion of injury to neighboring structures and soft tissues. Bleeding, infection, pain, erectile dysfunction, and spraying of urination were discussed. The  risk of neuropathy was discussed as well as the usual post-operative course.  We will proceed accordingly. I sent his urine for culture.     I agree with Page that I will look in the area of the fistula to see if there is any flap or ball valve effect or other anatomic  abnormalities.  After a thorough review of the management options for the patient's condition the patient  elected to proceed with surgical therapy as noted above. We have discussed the potential benefits and risks of the procedure, side effects of the proposed treatment, the likelihood of the patient achieving the goals of the procedure, and any potential problems that might occur during the procedure or recuperation. Informed consent has been obtained.

## 2010-12-05 ENCOUNTER — Encounter (HOSPITAL_BASED_OUTPATIENT_CLINIC_OR_DEPARTMENT_OTHER)
Admission: RE | Disposition: A | Discharge status: Home / Self Care | Payer: Self-pay | Source: Ambulatory Visit | Attending: Urology

## 2010-12-05 ENCOUNTER — Ambulatory Visit (HOSPITAL_BASED_OUTPATIENT_CLINIC_OR_DEPARTMENT_OTHER)
Admission: RE | Admit: 2010-12-05 | Discharge: 2010-12-05 | Disposition: A | Discharge status: Home / Self Care | Payer: BC Managed Care – PPO | Source: Ambulatory Visit | Attending: Urology | Admitting: Urology

## 2010-12-05 ENCOUNTER — Encounter (HOSPITAL_BASED_OUTPATIENT_CLINIC_OR_DEPARTMENT_OTHER): Payer: Self-pay | Admitting: Anesthesiology

## 2010-12-05 ENCOUNTER — Ambulatory Visit (HOSPITAL_BASED_OUTPATIENT_CLINIC_OR_DEPARTMENT_OTHER): Payer: BC Managed Care – PPO | Admitting: Anesthesiology

## 2010-12-05 DIAGNOSIS — N35919 Unspecified urethral stricture, male, unspecified site: Secondary | ICD-10-CM | POA: Insufficient documentation

## 2010-12-05 DIAGNOSIS — IMO0002 Reserved for concepts with insufficient information to code with codable children: Secondary | ICD-10-CM

## 2010-12-05 DIAGNOSIS — R300 Dysuria: Secondary | ICD-10-CM | POA: Insufficient documentation

## 2010-12-05 DIAGNOSIS — R39198 Other difficulties with micturition: Secondary | ICD-10-CM | POA: Insufficient documentation

## 2010-12-05 HISTORY — DX: Personal history of pulmonary embolism: Z86.711

## 2010-12-05 HISTORY — DX: Reserved for concepts with insufficient information to code with codable children: IMO0002

## 2010-12-05 SURGERY — BALLOON DILATION
Anesthesia: General | Site: Urethra | Wound class: Clean Contaminated

## 2010-12-05 MED ORDER — STERILE WATER FOR IRRIGATION IR SOLN
Status: DC | PRN
  Administered 2010-12-05: 3000 mL

## 2010-12-05 MED ORDER — FENTANYL CITRATE 0.05 MG/ML IJ SOLN
INTRAMUSCULAR | Status: DC | PRN
  Administered 2010-12-05 (×2): 50 ug via INTRAVENOUS

## 2010-12-05 MED ORDER — GENTAMICIN IN SALINE 1.6-0.9 MG/ML-% IV SOLN
80.0000 mg | INTRAVENOUS | Status: AC
  Administered 2010-12-05: 80 mg via INTRAVENOUS

## 2010-12-05 MED ORDER — ONDANSETRON HCL 4 MG/2ML IJ SOLN
INTRAMUSCULAR | Status: DC | PRN
  Administered 2010-12-05: 4 mg via INTRAVENOUS

## 2010-12-05 MED ORDER — LACTATED RINGERS IV SOLN: INTRAVENOUS | Status: DC

## 2010-12-05 MED ORDER — FENTANYL CITRATE 0.05 MG/ML IJ SOLN
25.0000 ug | INTRAMUSCULAR | Status: DC | PRN
  Administered 2010-12-05 (×2): 25 ug via INTRAVENOUS

## 2010-12-05 MED ORDER — DEXAMETHASONE SODIUM PHOSPHATE 4 MG/ML IJ SOLN
INTRAMUSCULAR | Status: DC | PRN
  Administered 2010-12-05: 8 mg via INTRAVENOUS

## 2010-12-05 MED ORDER — LIDOCAINE HCL (CARDIAC) 20 MG/ML IV SOLN
INTRAVENOUS | Status: DC | PRN
  Administered 2010-12-05: 50 mg via INTRAVENOUS

## 2010-12-05 MED ORDER — PROPOFOL 10 MG/ML IV EMUL
INTRAVENOUS | Status: DC | PRN
  Administered 2010-12-05: 200 mg via INTRAVENOUS

## 2010-12-05 MED ORDER — HYDROCODONE-ACETAMINOPHEN 5-500 MG PO TABS: 1.0000 | ORAL_TABLET | Freq: Four times a day (QID) | ORAL | Status: AC | PRN

## 2010-12-05 MED ORDER — IOHEXOL 350 MG/ML SOLN
INTRAVENOUS | Status: DC | PRN
  Administered 2010-12-05: 50 mL via INTRAVENOUS

## 2010-12-05 MED ORDER — LACTATED RINGERS IV SOLN
INTRAVENOUS | Status: DC
  Administered 2010-12-05: 10:00:00 via INTRAVENOUS
  Administered 2010-12-05: 100 mL/h via INTRAVENOUS

## 2010-12-05 MED ORDER — PROMETHAZINE HCL 25 MG/ML IJ SOLN: 6.2500 mg | INTRAMUSCULAR | Status: DC | PRN

## 2010-12-05 SURGICAL SUPPLY — 28 items
ADAPTER CATH URET PLST 4-6FR (CATHETERS) ×4 IMPLANT
BAG DRAIN URO-CYSTO SKYTR STRL (DRAIN) ×4 IMPLANT
BALLN NEPHROSTOMY (BALLOONS) ×4
BALLOON NEPHROSTOMY (BALLOONS) ×3 IMPLANT
CANISTER SUCT LVC 12 LTR MEDI- (MISCELLANEOUS) ×4 IMPLANT
CATH INTERMIT  6FR 70CM (CATHETERS) ×4 IMPLANT
CATH URET 5FR 28IN CONE TIP (BALLOONS)
CATH URET 5FR 28IN OPEN ENDED (CATHETERS) IMPLANT
CATH URET 5FR 70CM CONE TIP (BALLOONS) IMPLANT
CLOTH BEACON ORANGE TIMEOUT ST (SAFETY) ×4 IMPLANT
CYSTOGRAFIN 30% 250ML (MISCELLANEOUS) IMPLANT
DRAPE CAMERA CLOSED 9X96 (DRAPES) ×4 IMPLANT
GLOVE BIO SURGEON STRL SZ7.5 (GLOVE) ×4 IMPLANT
GOWN BRE IMP SLV AUR LG STRL (GOWN DISPOSABLE) ×4 IMPLANT
GOWN STRL REIN XL XLG (GOWN DISPOSABLE) ×4 IMPLANT
GUIDEWIRE 0.038 PTFE COATED (WIRE) IMPLANT
GUIDEWIRE ANG ZIPWIRE 038X150 (WIRE) IMPLANT
GUIDEWIRE STR DUAL SENSOR (WIRE) ×4 IMPLANT
KIT BALLIN UROMAX 15FX10 (LABEL) IMPLANT
KIT BALLN UROMAX 15FX4 (MISCELLANEOUS) IMPLANT
KIT BALLN UROMAX 26 75X4 (MISCELLANEOUS)
NS IRRIG 500ML POUR BTL (IV SOLUTION) IMPLANT
PACK CYSTOSCOPY (CUSTOM PROCEDURE TRAY) ×4 IMPLANT
SET HIGH PRES BAL DIL (LABEL)
SHEATH URET ACCESS 12FR/35CM (UROLOGICAL SUPPLIES) IMPLANT
SHEATH URET ACCESS 12FR/55CM (UROLOGICAL SUPPLIES) IMPLANT
SYR BULB IRRIGATION 50ML (SYRINGE) ×4 IMPLANT
WATER STERILE IRR 500ML POUR (IV SOLUTION) ×4 IMPLANT

## 2010-12-05 NOTE — Transfer of Care (Signed)
Immediate Anesthesia Transfer of Care Note  Patient: [name]  Procedure(s) Performed:  BALLOON DILATION; CYSTOSCOPY; URETHROGRAM  Patient Location: Patient transported to PACU with oxygen via face mask at 6 Liters / Min  Anesthesia Type: General  Level of Consciousness: awake and alert   Airway & Oxygen Therapy: Patient Spontanous Breathing and Patient connected to face mask oxygen Post-op Assessment: Report given to PACU RN and Post -op Vital signs reviewed and stable  Post vital signs: Reviewed and stable  Complications: No apparent anesthesia complications

## 2010-12-05 NOTE — Anesthesia Procedure Notes (Signed)
Procedure Name: LMA Insertion Date/Time: 12/05/2010 11:59 AM Performed by: Lorrin Jackson Pre-anesthesia Checklist: Patient identified, Emergency Drugs available, Suction available and Patient being monitored Patient Re-evaluated:Patient Re-evaluated prior to inductionOxygen Delivery Method: Circle System Utilized Preoxygenation: Pre-oxygenation with 100% oxygen Intubation Type: IV induction Ventilation: Mask ventilation without difficulty LMA: LMA with gastric port inserted LMA Size: 4.0 Number of attempts: 1 Placement Confirmation: positive ETCO2 Tube secured with: Tape Dental Injury: Teeth and Oropharynx as per pre-operative assessment

## 2010-12-05 NOTE — Op Note (Signed)
Preoperative diagnosis: Urethral stricture Postoperative diagnosis: Urethral stricture Surgery: Cystoscopy, retrograde urethrogram, balloon dilation of urethral stricture Surgeon: Dr. Lorin Picket Scotti Kosta  [name] has symptomatic complex urethral stricture disease. He was prepped and draped in the usual fashion. On inspection of the urethra I could see a small fistula proximal to his neo-meatus. I could ashy pass a 25 [place] scope approxi-1 inch into the urethra to the area of obstruction and I could visualize a 6-8 [place] narrowing.  I did a retrograde urethrogram using a cut piece of 6 French open-end ureteral catheter in the diary stopped through the urethra into the bladder. There looked to be like a narrowing in the area noted above  I then passed a sensor wire to the bladder. I passed the open-ended balloon catheter to the proximal urethra and distal prostatic urethra. I balloon dilated the stricture to 18 mmHg with the balloon coming out of the neo-meatus  I removed the balloon dilation catheter after 5 minutes and after deflating it. I scoped along the wire easily into the bladder. He bladder trabeculation and some saccules.  I removed a sensor wire. I scoped the entire urethra. 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 The urethra was wide open with some fraying of the mucosa. He did not have any perforation or bleeding. Catheter was not inserted.  The patient will be followed as per protocol.  In my opinion [name] would require full length urethral reconstruction to try to better his situation and I do not personally recommended. Ended at the

## 2010-12-05 NOTE — Anesthesia Preprocedure Evaluation (Addendum)
Anesthesia Evaluation  Patient identified by MRN, date of birth, ID band Patient awake    Reviewed: Allergy & Precautions, H&P , NPO status , Patient's Chart, lab work & pertinent test results  Airway Mallampati: II TM Distance: >3 FB Neck ROM: full    Dental No notable dental hx. (+) Teeth Intact and Dental Advisory Given   Pulmonary  Hx. PE clear to auscultation  Pulmonary exam normal       Cardiovascular Exercise Tolerance: Good neg cardio ROS regular Normal    Neuro/Psych Negative Neurological ROS  Negative Psych ROS   GI/Hepatic negative GI ROS, Neg liver ROS,   Endo/Other  Negative Endocrine ROS  Renal/GU negative Renal ROS  Genitourinary negative   Musculoskeletal   Abdominal   Peds  Hematology negative hematology ROS (+)   Anesthesia Other Findings   Reproductive/Obstetrics negative OB ROS                          Anesthesia Physical Anesthesia Plan  ASA: I  Anesthesia Plan: General   Post-op Pain Management:    Induction: Intravenous  Airway Management Planned: LMA  Additional Equipment:   Intra-op Plan:   Post-operative Plan:   Informed Consent: I have reviewed the patients History and Physical, chart, labs and discussed the procedure including the risks, benefits and alternatives for the proposed anesthesia with the patient or authorized representative who has indicated his/her understanding and acceptance.   Dental Advisory Given  Plan Discussed with: CRNA  Anesthesia Plan Comments:        Anesthesia Quick Evaluation

## 2010-12-05 NOTE — Anesthesia Postprocedure Evaluation (Signed)
  Anesthesia Post-op Note  Patient: [name]  Procedure(s) Performed:  BALLOON DILATION; CYSTOSCOPY; URETHROGRAM  Patient Location: PACU  Anesthesia Type: General  Level of Consciousness: awake and alert   Airway and Oxygen Therapy: Patient Spontanous Breathing  Post-op Pain: mild  Post-op Assessment: Post-op Vital signs reviewed, Patient's Cardiovascular Status Stable, Respiratory Function Stable, Patent Airway and No signs of Nausea or vomiting  Post-op Vital Signs: stable  Complications: No apparent anesthesia complications

## 2010-12-05 NOTE — Interval H&P Note (Signed)
History and Physical Interval Note:  12/05/2010 7:35 AM  Dugan Lacie Draft  has presented today for surgery, with the diagnosis of URETHRAL STRICTURE  The various methods of treatment have been discussed with the patient and family. After consideration of risks, benefits and other options for treatment, the patient has consented to  Procedure(s): CYSTOSCOPY WITH RETROGRADE PYELOGRAM as a surgical intervention .  The patients' history has been reviewed, patient examined, no change in status, stable for surgery.  I have reviewed the patients' chart and labs.  Questions were answered to the patient's satisfaction.     Massey Ruhland A

## 2010-12-05 NOTE — OR Nursing (Signed)
Dr. Macdiarmid by to see. 

## 2010-12-09 ENCOUNTER — Encounter (HOSPITAL_BASED_OUTPATIENT_CLINIC_OR_DEPARTMENT_OTHER): Payer: Self-pay | Admitting: Urology

## 2011-05-14 ENCOUNTER — Encounter (HOSPITAL_BASED_OUTPATIENT_CLINIC_OR_DEPARTMENT_OTHER): Payer: Self-pay | Admitting: *Deleted

## 2011-05-14 ENCOUNTER — Other Ambulatory Visit: Payer: Self-pay | Admitting: Urology

## 2011-05-14 NOTE — Progress Notes (Signed)
NPO AFTER MN. ARRIVES AT 0845. NEEDS HG. ASK MDA IF ?EKG.

## 2011-05-16 ENCOUNTER — Encounter (HOSPITAL_BASED_OUTPATIENT_CLINIC_OR_DEPARTMENT_OTHER): Payer: Self-pay | Admitting: Anesthesiology

## 2011-05-16 ENCOUNTER — Ambulatory Visit (HOSPITAL_BASED_OUTPATIENT_CLINIC_OR_DEPARTMENT_OTHER): Payer: PRIVATE HEALTH INSURANCE | Admitting: Anesthesiology

## 2011-05-16 ENCOUNTER — Ambulatory Visit (HOSPITAL_BASED_OUTPATIENT_CLINIC_OR_DEPARTMENT_OTHER)
Admission: RE | Admit: 2011-05-16 | Discharge: 2011-05-16 | Disposition: A | Discharge status: Home / Self Care | Payer: PRIVATE HEALTH INSURANCE | Source: Ambulatory Visit | Attending: Urology | Admitting: Urology

## 2011-05-16 ENCOUNTER — Encounter (HOSPITAL_BASED_OUTPATIENT_CLINIC_OR_DEPARTMENT_OTHER): Payer: Self-pay

## 2011-05-16 ENCOUNTER — Encounter (HOSPITAL_BASED_OUTPATIENT_CLINIC_OR_DEPARTMENT_OTHER)
Admission: RE | Disposition: A | Discharge status: Home / Self Care | Payer: Self-pay | Source: Ambulatory Visit | Attending: Urology

## 2011-05-16 DIAGNOSIS — N35919 Unspecified urethral stricture, male, unspecified site: Secondary | ICD-10-CM | POA: Insufficient documentation

## 2011-05-16 HISTORY — PX: CYSTOSCOPY WITH URETHRAL DILATATION: SHX5125

## 2011-05-16 LAB — POCT HEMOGLOBIN-HEMACUE: Hemoglobin: 14.5 g/dL (ref 13.0–17.0)

## 2011-05-16 SURGERY — CYSTOSCOPY, WITH URETHRAL DILATION
Anesthesia: General | Site: Bladder | Wound class: Clean Contaminated

## 2011-05-16 MED ORDER — HYDROCODONE-ACETAMINOPHEN 5-500 MG PO TABS: 1.0000 | ORAL_TABLET | Freq: Four times a day (QID) | ORAL | Status: AC | PRN

## 2011-05-16 MED ORDER — PROPOFOL 10 MG/ML IV EMUL
INTRAVENOUS | Status: DC | PRN
  Administered 2011-05-16: 220 mg via INTRAVENOUS

## 2011-05-16 MED ORDER — MEPERIDINE HCL 25 MG/ML IJ SOLN: 6.2500 mg | INTRAMUSCULAR | Status: DC | PRN

## 2011-05-16 MED ORDER — LACTATED RINGERS IV SOLN
INTRAVENOUS | Status: DC
  Administered 2011-05-16 (×2): via INTRAVENOUS

## 2011-05-16 MED ORDER — CIPROFLOXACIN HCL 250 MG PO TABS: 250.0000 mg | ORAL_TABLET | Freq: Two times a day (BID) | ORAL | Status: AC

## 2011-05-16 MED ORDER — GENTAMICIN IN SALINE 1.6-0.9 MG/ML-% IV SOLN
80.0000 mg | INTRAVENOUS | Status: AC
  Administered 2011-05-16: 80 mg via INTRAVENOUS

## 2011-05-16 MED ORDER — MIDAZOLAM HCL 5 MG/5ML IJ SOLN
INTRAMUSCULAR | Status: DC | PRN
  Administered 2011-05-16: 1 mg via INTRAVENOUS

## 2011-05-16 MED ORDER — LIDOCAINE HCL (CARDIAC) 20 MG/ML IV SOLN
INTRAVENOUS | Status: DC | PRN
  Administered 2011-05-16: 60 mg via INTRAVENOUS

## 2011-05-16 MED ORDER — LACTATED RINGERS IV SOLN: INTRAVENOUS | Status: DC

## 2011-05-16 MED ORDER — 0.9 % SODIUM CHLORIDE (POUR BTL) OPTIME
TOPICAL | Status: DC | PRN
  Administered 2011-05-16: 500 mL

## 2011-05-16 MED ORDER — SODIUM CHLORIDE 0.9 % IR SOLN
Status: DC | PRN
  Administered 2011-05-16: 3000 mL

## 2011-05-16 MED ORDER — PROMETHAZINE HCL 25 MG/ML IJ SOLN: 6.2500 mg | INTRAMUSCULAR | Status: DC | PRN

## 2011-05-16 MED ORDER — FENTANYL CITRATE 0.05 MG/ML IJ SOLN
INTRAMUSCULAR | Status: DC | PRN
  Administered 2011-05-16: 25 ug via INTRAVENOUS
  Administered 2011-05-16: 50 ug via INTRAVENOUS
  Administered 2011-05-16 (×2): 25 ug via INTRAVENOUS

## 2011-05-16 MED ORDER — ONDANSETRON HCL 4 MG/2ML IJ SOLN
INTRAMUSCULAR | Status: DC | PRN
  Administered 2011-05-16: 4 mg via INTRAVENOUS

## 2011-05-16 MED ORDER — DEXAMETHASONE SODIUM PHOSPHATE 4 MG/ML IJ SOLN
INTRAMUSCULAR | Status: DC | PRN
  Administered 2011-05-16: 10 mg via INTRAVENOUS

## 2011-05-16 MED ORDER — HYDROCODONE-ACETAMINOPHEN 5-325 MG PO TABS
1.0000 | ORAL_TABLET | Freq: Four times a day (QID) | ORAL | Status: DC | PRN
  Administered 2011-05-16: 1 via ORAL

## 2011-05-16 MED ORDER — FENTANYL CITRATE 0.05 MG/ML IJ SOLN: 25.0000 ug | INTRAMUSCULAR | Status: DC | PRN

## 2011-05-16 MED ORDER — IOHEXOL 350 MG/ML SOLN
INTRAVENOUS | Status: DC | PRN
  Administered 2011-05-16: 50 mL

## 2011-05-16 SURGICAL SUPPLY — 24 items
ADAPTER CATH URET PLST 4-6FR (CATHETERS) ×3 IMPLANT
BAG DRAIN URO-CYSTO SKYTR STRL (DRAIN) ×3 IMPLANT
BALLN NEPHROSTOMY (BALLOONS) ×3
BALLOON NEPHROSTOMY (BALLOONS) ×2 IMPLANT
CANISTER SUCT LVC 12 LTR MEDI- (MISCELLANEOUS) ×3 IMPLANT
CATH ROBINSON RED A/P 14FR (CATHETERS) IMPLANT
CATH URET 5FR 28IN CONE TIP (BALLOONS) ×1
CATH URET 5FR 70CM CONE TIP (BALLOONS) ×2 IMPLANT
CLOTH BEACON ORANGE TIMEOUT ST (SAFETY) ×3 IMPLANT
DRAPE CAMERA CLOSED 9X96 (DRAPES) ×3 IMPLANT
GLOVE BIO SURGEON STRL SZ7.5 (GLOVE) ×3 IMPLANT
GLOVE BIOGEL PI IND STRL 7.0 (GLOVE) ×2 IMPLANT
GLOVE BIOGEL PI INDICATOR 7.0 (GLOVE) ×1
GLOVE ECLIPSE 7.0 STRL STRAW (GLOVE) ×3 IMPLANT
GOWN PREVENTION PLUS LG XLONG (DISPOSABLE) ×3 IMPLANT
GOWN STRL REIN XL XLG (GOWN DISPOSABLE) ×3 IMPLANT
GOWN SURGICAL LARGE (GOWNS) ×3 IMPLANT
GOWN XL W/COTTON TOWEL STD (GOWNS) ×3 IMPLANT
GUIDEWIRE STR DUAL SENSOR (WIRE) ×3 IMPLANT
IV NS IRRIG 3000ML ARTHROMATIC (IV SOLUTION) ×3 IMPLANT
NS IRRIG 500ML POUR BTL (IV SOLUTION) ×3 IMPLANT
PACK CYSTOSCOPY (CUSTOM PROCEDURE TRAY) ×3 IMPLANT
SYRINGE IRR TOOMEY STRL 70CC (SYRINGE) IMPLANT
WATER STERILE IRR 3000ML UROMA (IV SOLUTION) IMPLANT

## 2011-05-16 NOTE — Op Note (Signed)
Preoperative diagnosis: Urethral stricture Postoperative diagnosis: Urethral stricture Surgery: Cystoscopy retrograde urethrogram and balloon dilation Surgeon: Dr. Lorin Picket Aryanne Gilleland  The patient has the above diagnoses. He consented the above procedure. Preoperative antibiotics were given.  In the anterior posterior position and lithotomy and the penis pulled the left side I did a retrograde urethrogram cutting off a 6 [place] open-ended ureteral catheter. Guide nicely rest up into the bladder with a reasonably adequate proximal bulbar urethra. I initially had cystoscoped the patient and he had what appeared to be attended 12 French stricture 1 inch proximal to the urethral meatus. It actually looked better than it has in the past.  I passed a sensor wire into the bladder. It initially was exiting the fistula at the mid penile urethra.  Under fluoroscopic guidance I passed the balloon dilation catheter to th the prostatic urethra. I dilated for 5 minutes at 18 atmospheres of pressure.  I deflated the balloon and re\re cystoscoped the patient has removing the balloon. He had panurethral stricture disease well dilated. He heavy bladder trabeculation. He had bilobar enlargement of the prostate. He no obvious injury to urethra distally. There was no bleeding. I left him without a catheter

## 2011-05-16 NOTE — Anesthesia Preprocedure Evaluation (Signed)
Anesthesia Evaluation  Patient identified by MRN, date of birth, ID band Patient awake    Reviewed: Allergy & Precautions, H&P , NPO status , Patient's Chart, lab work & pertinent test results  Airway Mallampati: II TM Distance: >3 FB Neck ROM: full    Dental No notable dental hx. (+) Teeth Intact and Dental Advisory Given   Pulmonary  H/o PE  breath sounds clear to auscultation  Pulmonary exam normal       Cardiovascular Exercise Tolerance: Good negative cardio ROS  Rhythm:regular Rate:Normal     Neuro/Psych negative neurological ROS  negative psych ROS   GI/Hepatic negative GI ROS, Neg liver ROS,   Endo/Other  negative endocrine ROS  Renal/GU negative Renal ROS  negative genitourinary   Musculoskeletal   Abdominal   Peds  Hematology negative hematology ROS (+)   Anesthesia Other Findings H/o cervical fusion   Reproductive/Obstetrics negative OB ROS                           Anesthesia Physical  Anesthesia Plan  ASA: I  Anesthesia Plan: General   Post-op Pain Management:    Induction: Intravenous  Airway Management Planned: LMA  Additional Equipment:   Intra-op Plan:   Post-operative Plan:   Informed Consent: I have reviewed the patients History and Physical, chart, labs and discussed the procedure including the risks, benefits and alternatives for the proposed anesthesia with the patient or authorized representative who has indicated his/her understanding and acceptance.   Dental Advisory Given  Plan Discussed with: CRNA  Anesthesia Plan Comments:         Anesthesia Quick Evaluation

## 2011-05-16 NOTE — Anesthesia Procedure Notes (Signed)
Procedure Name: LMA Insertion Date/Time: 05/16/2011 12:21 PM Performed by: Fran Lowes Pre-anesthesia Checklist: Patient identified, Emergency Drugs available, Suction available and Patient being monitored Patient Re-evaluated:Patient Re-evaluated prior to inductionOxygen Delivery Method: Circle System Utilized Preoxygenation: Pre-oxygenation with 100% oxygen Intubation Type: IV induction Ventilation: Mask ventilation without difficulty LMA: LMA inserted LMA Size: 4.0 Number of attempts: 1 Airway Equipment and Method: bite block Placement Confirmation: positive ETCO2 Tube secured with: Tape Dental Injury: Teeth and Oropharynx as per pre-operative assessment

## 2011-05-16 NOTE — Transfer of Care (Signed)
Immediate Anesthesia Transfer of Care Note  Patient: [name]  Procedure(s) Performed: Procedure(s) (LRB): CYSTOSCOPY WITH URETHRAL DILATATION (N/A)  Patient Location: Patient transported to PACU with oxygen via face mask at 4 Liters / Min  Anesthesia Type: General  Level of Consciousness: awake and alert   Airway & Oxygen Therapy: Patient Spontanous Breathing and Patient connected to face mask oxygen  Post-op Assessment: Report given to PACU RN and Post -op Vital signs reviewed and stable  Post vital signs: Reviewed and stable  Dentition: Teeth and oropharynx remain in pre-op condition  Complications: No apparent anesthesia complications

## 2011-05-16 NOTE — Interval H&P Note (Signed)
History and Physical Interval Note:  05/16/2011 7:10 AM  [name]  has presented today for surgery, with the diagnosis of URETHRAL STRICTURE  The various methods of treatment have been discussed with the patient and family. After consideration of risks, benefits and other options for treatment, the patient has consented to  Procedure(s) (LRB): CYSTOSCOPY WITH URETHRAL DILATATION (N/A) URETHROGRAM (N/A) as a surgical intervention .  The patients' history has been reviewed, patient examined, no change in status, stable for surgery.  I have reviewed the patients' chart and labs.  Questions were answered to the patient's satisfaction.     [name] A

## 2011-05-16 NOTE — Discharge Instructions (Signed)
Date of admission: 05/16/2011  Date of discharge: 05/16/2011  Admission diagnosis: ***  Discharge diagnosis: ***  Secondary diagnoses: ***  History and Physical: For full details, please see admission history and physical. Briefly, [name] is a 46 y.o. year old patient with ***.   Hospital Course: ***  Laboratory values:  Basename 05/16/11 1114  HGB 14.5  HCT --   No results found for this basename: CREATININE:2 in the last 72 hours  Disposition: Home  Discharge instruction: The patient was instructed to be ambulatory but told to refrain from heavy lifting, strenuous activity, or driving. ***  Discharge medications:  Medication List  As of 05/16/2011 12:49 PM   START taking these medications         ciprofloxacin 250 MG tablet   Commonly known as: CIPRO   Take 1 tablet (250 mg total) by mouth 2 (two) times daily.      HYDROcodone-acetaminophen 5-500 MG per tablet   Commonly known as: VICODIN   Take 1 tablet by mouth every 6 (six) hours as needed for pain.         CONTINUE taking these medications         acetaminophen 500 MG tablet   Commonly known as: TYLENOL      rizatriptan 10 MG tablet   Commonly known as: MAXALT          Where to get your medications    These are the prescriptions that you need to pick up.   You may get these medications from any pharmacy.         ciprofloxacin 250 MG tablet   HYDROcodone-acetaminophen 5-500 MG per tablet            Followup:

## 2011-05-16 NOTE — Anesthesia Postprocedure Evaluation (Signed)
  Anesthesia Post-op Note  Patient: [name]  Procedure(s) Performed: Procedure(s) (LRB): CYSTOSCOPY WITH URETHRAL DILATATION (N/A)  Patient Location: PACU  Anesthesia Type: General  Level of Consciousness: awake and alert   Airway and Oxygen Therapy: Patient Spontanous Breathing  Post-op Pain: mild  Post-op Assessment: Post-op Vital signs reviewed, Patient's Cardiovascular Status Stable, Respiratory Function Stable, Patent Airway and No signs of Nausea or vomiting  Post-op Vital Signs: stable  Complications: No apparent anesthesia complications

## 2011-05-16 NOTE — H&P (Signed)
History of Present Illness   Mr. Tupy since his dilation was actually doing quite well. He had an intermittent decrease in force of his stream but he actually was stable. He had intercourse a few days ago. He feels that something was disrupted. He feels something is blocking. He burns a little bit when he urinates. There is no other modifying factors or associated signs or symptoms. There is no other aggravating or relieving factors. His symptoms are mild in severity and persistent. He has a slow stream.  Urinalysis: I reviewed, negative. Urine was sent for culture.  Review of systems: No change in bowel or neurologic status.    Past Medical History Problems  1. History of  Arthritis V13.4  Surgical History Problems  1. 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 History of  Cystoscopy For Urethral Stricture 3. History of  Cystoscopy For Urethral Stricture 4. History of  Cystoscopy For Urethral Stricture 5. History of  Cystoscopy For Urethral Stricture 6. History of  Neck Surgery 7. History of  Surgery Penis Repair Of Hypospadias Complications  Current Meds 1. Cipro TABS; Therapy: (Recorded:16Nov2012) to 2. Maxalt TABS; Therapy: (Recorded:21May2008) to  Allergies Medication  1. No Known Drug Allergies  Family History Problems  1. Family history of  Family Health Status Number Of Children 1 DAUGHTER 2. Family history of  Hematuria 3. Family history of  Renal Failure  Social History Problems  1. Alcohol Use 0.7 PPD 2. Caffeine Use 5 PPD 3. Marital History - Currently Married 4. Never A Smoker Denied  5. Tobacco Use  Results/Data  Urine [Data Includes: Last 1 Day]   07May2013  COLOR YELLOW   APPEARANCE CLEAR   SPECIFIC GRAVITY 1.025   pH 6.0   GLUCOSE NEG mg/dL  BILIRUBIN NEG   KETONE NEG mg/dL  BLOOD NEG   PROTEIN NEG mg/dL  UROBILINOGEN 0.2 mg/dL  NITRITE NEG   LEUKOCYTE ESTERASE NEG    Assessment Assessed  1. Urinary Stream Is Smaller  788.62 2. Dysuria 788.1  Plan   Discussion/Summary   It is hard to say but it sounds like Mr. Morgenstern has had a recurrence of his stricture. He is very familiar with balloon dilation. In my opinion he likely benefit from a full length urethroplasty. He is very nervous and cannot be manipulated in the office.   He and I both think it is a stricture recurrence. He would like to have it dilated as soon as possible. He is well versed in the pros and cons and risks, etc. We will proceed accordingly, ASAP.  After a thorough review of the management options for the patient's condition the patient  elected to proceed with surgical therapy as noted above. We have discussed the potential benefits and risks of the procedure, side effects of the proposed treatment, the likelihood of the patient achieving the goals of the procedure, and any potential problems that might occur during the procedure or recuperation. Informed consent has been obtained.

## 2011-05-19 ENCOUNTER — Encounter (HOSPITAL_BASED_OUTPATIENT_CLINIC_OR_DEPARTMENT_OTHER): Payer: Self-pay | Admitting: Urology

## 2011-06-20 ENCOUNTER — Ambulatory Visit (HOSPITAL_COMMUNITY)
Admission: RE | Admit: 2011-06-20 | Discharge: 2011-06-21 | Disposition: A | Discharge status: Home / Self Care | Payer: PRIVATE HEALTH INSURANCE | Source: Ambulatory Visit | Attending: Urology | Admitting: Urology

## 2011-06-20 ENCOUNTER — Encounter (HOSPITAL_COMMUNITY): Payer: Self-pay | Admitting: *Deleted

## 2011-06-20 ENCOUNTER — Encounter (HOSPITAL_COMMUNITY): Payer: Self-pay | Admitting: Anesthesiology

## 2011-06-20 ENCOUNTER — Encounter (HOSPITAL_COMMUNITY)
Admission: RE | Disposition: A | Discharge status: Home / Self Care | Payer: Self-pay | Source: Ambulatory Visit | Attending: Urology

## 2011-06-20 ENCOUNTER — Ambulatory Visit (HOSPITAL_COMMUNITY): Payer: PRIVATE HEALTH INSURANCE | Admitting: Anesthesiology

## 2011-06-20 ENCOUNTER — Other Ambulatory Visit: Payer: Self-pay | Admitting: Urology

## 2011-06-20 DIAGNOSIS — N3289 Other specified disorders of bladder: Secondary | ICD-10-CM | POA: Insufficient documentation

## 2011-06-20 DIAGNOSIS — N1330 Unspecified hydronephrosis: Secondary | ICD-10-CM | POA: Insufficient documentation

## 2011-06-20 DIAGNOSIS — N201 Calculus of ureter: Secondary | ICD-10-CM | POA: Insufficient documentation

## 2011-06-20 DIAGNOSIS — R109 Unspecified abdominal pain: Secondary | ICD-10-CM | POA: Insufficient documentation

## 2011-06-20 DIAGNOSIS — Q549 Hypospadias, unspecified: Secondary | ICD-10-CM | POA: Insufficient documentation

## 2011-06-20 DIAGNOSIS — N35919 Unspecified urethral stricture, male, unspecified site: Secondary | ICD-10-CM | POA: Insufficient documentation

## 2011-06-20 DIAGNOSIS — N323 Diverticulum of bladder: Secondary | ICD-10-CM | POA: Insufficient documentation

## 2011-06-20 HISTORY — DX: Migraine with aura, not intractable, without status migrainosus: G43.109

## 2011-06-20 HISTORY — PX: CYSTOSCOPY/RETROGRADE/URETEROSCOPY/STONE EXTRACTION WITH BASKET: SHX5317

## 2011-06-20 HISTORY — PX: BALLOON DILATION: SHX5330

## 2011-06-20 LAB — SURGICAL PCR SCREEN
MRSA, PCR: NEGATIVE
Staphylococcus aureus: NEGATIVE

## 2011-06-20 SURGERY — CYSTOSCOPY, WITH CALCULUS REMOVAL USING BASKET
Anesthesia: General | Site: Ureter | Laterality: Left

## 2011-06-20 MED ORDER — FENTANYL CITRATE 0.05 MG/ML IJ SOLN
INTRAMUSCULAR | Status: AC
  Filled 2011-06-20: qty 2

## 2011-06-20 MED ORDER — FENTANYL CITRATE 0.05 MG/ML IJ SOLN
INTRAMUSCULAR | Status: DC | PRN
  Administered 2011-06-20 (×2): 50 ug via INTRAVENOUS

## 2011-06-20 MED ORDER — ACETAMINOPHEN 10 MG/ML IV SOLN
INTRAVENOUS | Status: DC | PRN
  Administered 2011-06-20: 1000 mg via INTRAVENOUS

## 2011-06-20 MED ORDER — CEFAZOLIN SODIUM-DEXTROSE 2-3 GM-% IV SOLR
INTRAVENOUS | Status: AC
  Filled 2011-06-20: qty 50

## 2011-06-20 MED ORDER — FENTANYL CITRATE 0.05 MG/ML IJ SOLN
25.0000 ug | INTRAMUSCULAR | Status: DC | PRN
  Administered 2011-06-20 (×2): 50 ug via INTRAVENOUS

## 2011-06-20 MED ORDER — IOHEXOL 300 MG/ML  SOLN
INTRAMUSCULAR | Status: DC | PRN
  Administered 2011-06-20: 50 mL via INTRAVENOUS

## 2011-06-20 MED ORDER — HYDROMORPHONE HCL PF 1 MG/ML IJ SOLN
0.5000 mg | INTRAMUSCULAR | Status: DC | PRN
  Administered 2011-06-20 – 2011-06-21 (×2): 1 mg via INTRAVENOUS
  Filled 2011-06-20 (×2): qty 1

## 2011-06-20 MED ORDER — MUPIROCIN 2 % EX OINT
TOPICAL_OINTMENT | CUTANEOUS | Status: AC
  Filled 2011-06-20: qty 22

## 2011-06-20 MED ORDER — SULFAMETHOXAZOLE-TMP DS 800-160 MG PO TABS
1.0000 | ORAL_TABLET | Freq: Two times a day (BID) | ORAL | Status: DC
  Administered 2011-06-20 – 2011-06-21 (×2): 1 via ORAL
  Filled 2011-06-20 (×3): qty 1

## 2011-06-20 MED ORDER — LIDOCAINE HCL 1 % IJ SOLN
INTRAMUSCULAR | Status: DC | PRN
  Administered 2011-06-20: 80 mg via INTRADERMAL

## 2011-06-20 MED ORDER — OXYCODONE-ACETAMINOPHEN 5-325 MG PO TABS
1.0000 | ORAL_TABLET | ORAL | Status: DC | PRN
  Administered 2011-06-21: 2 via ORAL
  Filled 2011-06-20: qty 2

## 2011-06-20 MED ORDER — ACETAMINOPHEN 10 MG/ML IV SOLN
INTRAVENOUS | Status: AC
  Filled 2011-06-20: qty 100

## 2011-06-20 MED ORDER — MIDAZOLAM HCL 5 MG/5ML IJ SOLN
INTRAMUSCULAR | Status: DC | PRN
  Administered 2011-06-20: 2 mg via INTRAVENOUS

## 2011-06-20 MED ORDER — SODIUM CHLORIDE 0.9 % IV SOLN
INTRAVENOUS | Status: DC | PRN
  Administered 2011-06-20: 1000 mL via INTRAMUSCULAR

## 2011-06-20 MED ORDER — PROMETHAZINE HCL 25 MG/ML IJ SOLN: 6.2500 mg | INTRAMUSCULAR | Status: DC | PRN

## 2011-06-20 MED ORDER — LACTATED RINGERS IV SOLN
INTRAVENOUS | Status: DC | PRN
  Administered 2011-06-20: 18:00:00 via INTRAVENOUS

## 2011-06-20 MED ORDER — DEXTROSE-NACL 5-0.45 % IV SOLN
INTRAVENOUS | Status: DC
  Administered 2011-06-20: 21:00:00 via INTRAVENOUS

## 2011-06-20 MED ORDER — CEFAZOLIN SODIUM 1-5 GM-% IV SOLN
INTRAVENOUS | Status: DC | PRN
  Administered 2011-06-20: 2 g via INTRAVENOUS

## 2011-06-20 MED ORDER — LACTATED RINGERS IV SOLN: INTRAVENOUS | Status: DC

## 2011-06-20 MED ORDER — ONDANSETRON HCL 4 MG/2ML IJ SOLN: 4.0000 mg | INTRAMUSCULAR | Status: DC | PRN

## 2011-06-20 MED ORDER — ONDANSETRON HCL 4 MG/2ML IJ SOLN
INTRAMUSCULAR | Status: DC | PRN
  Administered 2011-06-20: 4 mg via INTRAVENOUS

## 2011-06-20 MED ORDER — SENNOSIDES-DOCUSATE SODIUM 8.6-50 MG PO TABS
2.0000 | ORAL_TABLET | Freq: Every day | ORAL | Status: DC
  Administered 2011-06-20: 2 via ORAL
  Filled 2011-06-20 (×2): qty 2

## 2011-06-20 MED ORDER — IOHEXOL 300 MG/ML  SOLN
INTRAMUSCULAR | Status: AC
  Filled 2011-06-20: qty 1

## 2011-06-20 MED ORDER — LIDOCAINE HCL 2 % EX GEL
CUTANEOUS | Status: AC
  Filled 2011-06-20: qty 10

## 2011-06-20 MED ORDER — PROPOFOL 10 MG/ML IV EMUL
INTRAVENOUS | Status: DC | PRN
  Administered 2011-06-20: 200 mg via INTRAVENOUS

## 2011-06-20 SURGICAL SUPPLY — 20 items
ADAPTER CATH URET PLST 4-6FR (CATHETERS) ×3 IMPLANT
BAG URINE LEG 500ML (DRAIN) ×3 IMPLANT
BAG URO CATCHER STRL LF (DRAPE) ×3 IMPLANT
BASKET LASER NITINOL 1.9FR (BASKET) ×3 IMPLANT
BASKET ZERO TIP NITINOL 2.4FR (BASKET) IMPLANT
CATH FOLEY 2WAY SLVR  5CC 16FR (CATHETERS) ×1
CATH FOLEY 2WAY SLVR 5CC 16FR (CATHETERS) ×2 IMPLANT
CATH INTERMIT  6FR 70CM (CATHETERS) IMPLANT
CATH URET 5FR 28IN OPEN ENDED (CATHETERS) ×3 IMPLANT
CLOTH BEACON ORANGE TIMEOUT ST (SAFETY) ×3 IMPLANT
DRAPE CAMERA CLOSED 9X96 (DRAPES) ×3 IMPLANT
GLOVE BIOGEL M STRL SZ7.5 (GLOVE) ×3 IMPLANT
GOWN PREVENTION PLUS XLARGE (GOWN DISPOSABLE) ×3 IMPLANT
GOWN STRL NON-REIN LRG LVL3 (GOWN DISPOSABLE) ×6 IMPLANT
GUIDEWIRE ANG ZIPWIRE 038X150 (WIRE) ×3 IMPLANT
GUIDEWIRE STR DUAL SENSOR (WIRE) ×6 IMPLANT
MANIFOLD NEPTUNE II (INSTRUMENTS) ×3 IMPLANT
PACK CYSTO (CUSTOM PROCEDURE TRAY) ×3 IMPLANT
STENT CONTOUR 6FRX26X.038 (STENTS) ×3 IMPLANT
TUBING CONNECTING 10 (TUBING) ×3 IMPLANT

## 2011-06-20 NOTE — Preoperative (Signed)
Beta Blockers   Reason not to administer Beta Blockers:Not Applicable 

## 2011-06-20 NOTE — Transfer of Care (Signed)
Immediate Anesthesia Transfer of Care Note  Patient: [name]  Procedure(s) Performed: Procedure(s) (LRB): CYSTOSCOPY/RETROGRADE/URETEROSCOPY/STONE EXTRACTION WITH BASKET (Left) BALLOON DILATION (Left) HOLMIUM LASER APPLICATION (Left)  Patient Location: PACU  Anesthesia Type: General  Level of Consciousness: awake, alert  and oriented  Airway & Oxygen Therapy: Patient Spontanous Breathing and Patient connected to face mask oxygen  Post-op Assessment: Report given to PACU RN and Post -op Vital signs reviewed and stable  Post vital signs: Reviewed and stable  Complications: No apparent anesthesia complications

## 2011-06-20 NOTE — Anesthesia Preprocedure Evaluation (Addendum)
Anesthesia Evaluation  Patient identified by MRN, date of birth, ID band Patient awake    Reviewed: Allergy & Precautions, H&P , NPO status , Patient's Chart, lab work & pertinent test results  Airway Mallampati: II TM Distance: >3 FB Neck ROM: full    Dental No notable dental hx. (+) Teeth Intact and Dental Advisory Given   Pulmonary  History of PE postsurgical 2008 breath sounds clear to auscultation  Pulmonary exam normal       Cardiovascular Exercise Tolerance: Good negative cardio ROS  Rhythm:regular Rate:Normal     Neuro/Psych  Headaches, negative neurological ROS  negative psych ROS   GI/Hepatic negative GI ROS, Neg liver ROS,   Endo/Other  negative endocrine ROS  Renal/GU negative Renal ROS  negative genitourinary   Musculoskeletal   Abdominal   Peds  Hematology negative hematology ROS (+)   Anesthesia Other Findings H/o cervical fusion   Reproductive/Obstetrics negative OB ROS                           Anesthesia Physical Anesthesia Plan  ASA: II and Emergent  Anesthesia Plan: General   Post-op Pain Management:    Induction: Intravenous  Airway Management Planned: Oral ETT and LMA  Additional Equipment:   Intra-op Plan:   Post-operative Plan: Extubation in OR  Informed Consent: I have reviewed the patients History and Physical, chart, labs and discussed the procedure including the risks, benefits and alternatives for the proposed anesthesia with the patient or authorized representative who has indicated his/her understanding and acceptance.   Dental advisory given  Plan Discussed with: CRNA  Anesthesia Plan Comments:         Anesthesia Quick Evaluation

## 2011-06-20 NOTE — Anesthesia Postprocedure Evaluation (Signed)
Anesthesia Post Note  Patient: [name]  Procedure(s) Performed: Procedure(s) (LRB): CYSTOSCOPY/RETROGRADE/URETEROSCOPY/STONE EXTRACTION WITH BASKET (Left) BALLOON DILATION (Left) CYSTOSCOPY WITH STENT PLACEMENT (Left)  Anesthesia type: General  Patient location: PACU  Post pain: Pain level controlled  Post assessment: Post-op Vital signs reviewed  Last Vitals:  Filed Vitals:   06/20/11 1930  BP:   Pulse:   Temp: 36.7 C  Resp:     Post vital signs: Reviewed  Level of consciousness: sedated  Complications: No apparent anesthesia complications

## 2011-06-20 NOTE — Procedures (Signed)
DX: Left ureteral stone, urethral stricture   PROC: Left ureteroscopic stone maniulation   Urethral Dilation   SURGEON: Marlette Curvin   ANES: LMA   FIND:   1 - hypospadias  2 - penile urethral stricture  3 - multiple bladder diverticulae  4 - left distal ureteral stone   EBL: nil   SPECIMEN: Stone for familiy   DRAIN: 39F foley to gravity drain   DISP: Stable to PACU --> Observation admission

## 2011-06-21 MED ORDER — SENNOSIDES-DOCUSATE SODIUM 8.6-50 MG PO TABS: 2.0000 | ORAL_TABLET | Freq: Every day | ORAL | Status: DC

## 2011-06-21 MED ORDER — TAMSULOSIN HCL 0.4 MG PO CAPS: 0.4000 mg | ORAL_CAPSULE | Freq: Every day | ORAL | Status: DC

## 2011-06-21 MED ORDER — OXYCODONE-ACETAMINOPHEN 5-325 MG PO TABS: 1.0000 | ORAL_TABLET | ORAL | Status: AC | PRN

## 2011-06-21 MED ORDER — SULFAMETHOXAZOLE-TMP DS 800-160 MG PO TABS: 1.0000 | ORAL_TABLET | Freq: Every day | ORAL | Status: AC

## 2011-06-21 NOTE — Discharge Summary (Signed)
Physician Discharge Summary  Patient ID: [name] MRN: [id number] DOB/AGE: 47-Jan-1967 47 y.o.  Admit date: 06/20/2011 Discharge date: 06/21/2011  Admission Diagnoses: Left Ureteral Stone, Flank Pain  Discharge Diagnoses:  Left Ureteral Stone s/p surgery  Discharged Condition: good  Hospital Course:  Admitted 06-20-11 with acute left flank pain form distal left ureteral stone. Underwent left ureteroscopic stone manipulation and urethral dilation with placement of left ureteral stent 06-20-11.  On AM of 6-15, the day of discharge, pt was tollerating a diet, ambulatory, had pain controlled and was suitable for discharge.  Consults: None  Significant Diagnostic Studies: retrograde pylogram - left ureteral stone  Treatments: surgery:  left ureteroscopic stone manipulation and urethral dilation with placement of left ureteral stent 06-20-11.   Discharge Exam: Blood pressure 115/79, pulse 90, temperature 98.9 F (37.2 C), temperature source Oral, resp. rate 18, height 5\' 11"  (1.803 m), weight 80.287 kg (177 lb), SpO2 92.00%. General appearance: alert, cooperative and appears stated age Head: Normocephalic, without obvious abnormality, atraumatic Eyes: conjunctivae/corneas clear. PERRL, EOM's intact. Fundi benign. Throat: lips, mucosa, and tongue normal; teeth and gums normal Neck: no adenopathy, no carotid bruit, no JVD, supple, symmetrical, trachea midline and thyroid not enlarged, symmetric, no tenderness/mass/nodules Resp: clear to auscultation bilaterally Chest wall: no tenderness Cardio: regular rate and rhythm, S1, S2 normal, no murmur, click, rub or gallop GI: soft, non-tender; bowel sounds normal; no masses,  no organomegaly Male genitalia: normal, foley in place with pink urine, removed. distal stent string taped to penis Extremities: extremities normal, atraumatic, no cyanosis or edema Pulses: 2+ and symmetric Neurologic: Grossly normal  Disposition: 01-Home or Self  Care   Medication List  As of 06/21/2011  6:48 AM   STOP taking these medications         ciprofloxacin 500 MG tablet      HYDROmorphone 4 MG tablet      traMADol 50 MG tablet         TAKE these medications         acetaminophen 500 MG tablet   Commonly known as: TYLENOL   Take 500 mg by mouth every 6 (six) hours as needed. Pain      oxyCODONE-acetaminophen 5-325 MG per tablet   Commonly known as: PERCOCET   Take 1-2 tablets by mouth every 4 (four) hours as needed for pain.      rizatriptan 10 MG tablet   Commonly known as: MAXALT   Take 10 mg by mouth as needed. May repeat in 2 hours if needed for migraines      senna-docusate 8.6-50 MG per tablet   Commonly known as: Senokot-S   Take 2 tablets by mouth at bedtime. While taking pain meds to prevent constipation      sulfamethoxazole-trimethoprim 800-160 MG per tablet   Commonly known as: BACTRIM DS   Take 1 tablet by mouth daily.      Tamsulosin HCl 0.4 MG Caps   Commonly known as: FLOMAX   Take 1 capsule (0.4 mg total) by mouth daily.           Follow-up Information    Follow up with MACDIARMID,[name] A, MD. (Call Monday AM to set up appointment in about 2 weeks)    Contact information:   [address] [facility] [address] [facility] [place] [address] [phone]          Signed: Sebastian Ache 06/21/2011, 6:48 AM

## 2011-06-21 NOTE — Discharge Instructions (Signed)
Call MD for inability to urinate, severe pain, or fever >101.

## 2011-06-21 NOTE — Progress Notes (Signed)
Pt left at 1100 this am with his wife at his side. Pt alert, oriented, and without c/o. Discharge instructions/prescriptions given/explained with pt and his wife verbalizing understanding. Followup appt noted.

## 2011-06-22 NOTE — Op Note (Signed)
[name], [name] NO.:  [id number]  MEDICAL RECORD NO.:  [id number]  LOCATION:  [facility]                         FACILITY:  [facility]  PHYSICIAN:  Sebastian Ache, MD     DATE OF BIRTH:  Feb 12, 1965  DATE OF PROCEDURE: DATE OF DISCHARGE:                              OPERATIVE REPORT   DIAGNOSES: 1. Left ureteral stone. 2. Flank pain. 3. History of urethral stricture disease. 4. Hypospadias.  PROCEDURE: 1. Urethral dilation. 2. Left retrograde pyelogram with interpretation. 3. Left ureteroscopic stone manipulation with basket extraction of     stone. 4. Placement of left 6 x 26 double-J stent with tether.  ESTIMATED BLOOD LOSS:  Nil.  FINDINGS: 1. Approximately 3-cm long segment of urethral stricture in the penile     urethra.  This was dilated sequentially with increasing size scopes     to 22-French. 2. Trabeculated bladder with at least 3 diverticula that appeared     simple. 3. Left distal ureteral stone with mild hydroureteronephrosis.  SPECIMENS:  Left ureteral stone, given to family.  DRAINS:  16-French Foley catheter straight drain.  INDICATIONS:  [name] is a pleasant 46 year old gentleman with long history of hypospadias, status post several repairs as well as urethral stricture disease who presented with several days of acute left flank pain.  The CT imaging consistent with a left distal ureteral stone.  He failed the trial of medical management.  Options were discussed.  He wished to proceed with left ureteroscopic stone manipulation.  He had no infectious parameters.  Informed consent was obtained and placed in the medical record.  PROCEDURE IN DETAIL:  Patient being [name], verified; procedure being left ureteroscopic stone manipulation was confirmed.  The procedure was carried out.  Time-out was performed.  Intravenous antibiotics administered.  General LMA anesthesia was introduced. Patient was placed in a low lithotomy  position.  Sterile field was created by prepping and draping the patient's penis, perineum, and proximal thighs using iodine x3.  Next, a cystourethroscopy was performed using a 22-French rigid cystoscope with 12 degree lens.  The urethra was hypospadiac proximal to the corona.  Within the penile segment, there was a 3 cm long segment of significant stricture.  It was sequentially dilated using first the obturator, and subsequently the 22-French scope.  The urinary bladder was significantly trabeculated and there were at least 3 simple-appearing diverticula. Ureteral orifices were in the normal anatomic position.  The left ureteral orifice was gently cannulated using a 6-French angled catheter and left retrograde pyelogram was seen.  Left retrograde pyelogram demonstrated a single left ureter with single system of kidney.  There was very mild hydroureteronephrosis to the level of filling defect in distal ureter consistent with known stone.  A Sensor wire was advanced at the level of the kidney as a safety wire.  Next, a rigid ureteroscopy was performed of the left distal ureter.  The stone in question was seen, grabbed in as escape type  basket and brought out in its entirety and set aside for specimen.  Repeat ureteroscopy of the distal two-third of ureter revealed no additional calcifications and no mucosal abnormalities.  As such, the ureteroscope  was re-exchanged for the cystoscope and a new 6 x 26 double- J stent was placed using cystoscopic and fluoroscopic guidance.  Good proximal and distal curl were noted.  A 16-French Foley catheter was placed to straight drain with 5 cc of water in the balloon.  The stent string was fashioned to the dorsum of the penis using tape.  Procedure was then terminated and patient tolerated the procedure well.  There were no immediate perioperative complications.  The patient was taken to postanesthesia care unit in stable condition.           ______________________________ Sebastian Ache, MD     TM/MEDQ  D:  06/20/2011  T:  06/21/2011  Job:  [id number]

## 2011-06-25 ENCOUNTER — Encounter (HOSPITAL_COMMUNITY): Payer: Self-pay | Admitting: Urology

## 2011-06-25 NOTE — H&P (Signed)
History of Present Illness  This 46 year old male comes in today, the second time in 2 days, for left flank pain secondary to a left distal ureteral stone. This is his first stone. He was initially treated in [place], [place] about 3 or 4 days ago. CT urogram was performed. The patient has had no fevers, chills, no significant nausea or vomiting. He was seen yesterday in the office here by a mid-level, but refused an injection to his phobia of needles.  His wife is worried that he has a blood clot. He does have a history of a pulmonary embolus following a prior anesthetic procedure.  He does have a urethral stricture that has been managed by Dr. Sherron Monday. Apparently, he had a urethral dilatation with a balloon in May, 2013. He worries that his stream is smaller now and that he may need this redone at some point.   Past Medical History Problems  1. History of  Arthritis V13.4  Surgical History Problems  1. 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 History of  Cystoscopy For Urethral Stricture 3. History of  Cystoscopy For Urethral Stricture 4. History of  Cystoscopy For Urethral Stricture 5. History of  Cystoscopy For Urethral Stricture 6. History of  Cystoscopy For Urethral Stricture 7. History of  Neck Surgery 8. History of  Surgery Penis Repair Of Hypospadias Complications  Current Meds 1. Cipro TABS; Therapy: (Recorded:16Nov2012) to 2. HYDROmorphone HCl 4 MG Oral Tablet; TAKE 1 TABLET EVERY 4 TO 6 HOURS AS NEEDED  FOR PAIN; Therapy: 13Jun2013 to (Complete:20Jun2013); Last Rx:13Jun2013 3. Maxalt TABS; Therapy: (Recorded:21May2008) to  Allergies Medication  1. No Known Drug Allergies  Family History Problems  1. Family history of  Family Health Status Number Of Children 1 DAUGHTER 2. Family history of  Hematuria 3. Family history of  Renal Failure  Social History Problems  1. Alcohol Use 0.7 PPD 2. Caffeine Use 5 PPD 3. Marital History - Currently  Married 4. Never A Smoker  Review of Systems Genitourinary, constitutional, skin, eye, otolaryngeal, hematologic/lymphatic, cardiovascular, pulmonary, endocrine, musculoskeletal, gastrointestinal, neurological and psychiatric system(s) were reviewed and pertinent findings if present are noted.  Genitourinary: urinary frequency, feelings of urinary urgency and weak urinary stream.    Vitals Vital Signs [Data Includes: Last 1 Day]  14Jun2013 01:03PM  Blood Pressure: 135 / 87 Temperature: 98.5 F Heart Rate: 96  Physical Exam Constitutional: Well nourished and well developed . In acute distress.  ENT:. The ears and nose are normal in appearance.  Neck: The appearance of the neck is normal.  Abdomen: The abdomen is flat. Moderate tenderness in the LLQ is present. moderate left CVA tenderness.    Results/Data     I reviewed the patient's transcribed CT report. His urinalysis revealed no evidence of infection, a small amount of blood microscopically. I viewed the patient's KUB with both he and his wife. There is a 4-5 mm calcification in an area consistent with a left UVJ. This seemed to be a couple of calcifications laterally overlying the left renal contour     Procedure  The patient was given 60 mg of Toradol IM   Assessment Assessed  1. Distal Ureteral Stone On The Left 592.1 2. Urethral Stricture 598.9   1. Persistent, quite symptomatic left distal ureteral stone. The patient would like to have this managed today if possible. He has not done well with medical therapy  2. History of urethral stricture, managed by Dr. Sherron Monday. This was last dilated in  May, 2013.   Plan  Distal Ureteral Stone On The Left (592.1)  1. Ketorolac Tromethamine 60 MG/2ML Injection Solution; INJECT 60  MG Intramuscular; Done:  14Jun2013 01:20PM; Status: COMPLETE  KUB  Status: Resulted - Requires Verification  Done: 01Jan0001 12:00AM Ordered Today; For: Distal Ureteral Stone On The Left (592.1);  Ordered By: Marcine Matar  Due: 16Jun2013 Marked Important; Last Updated By: Dorcas Mcmurray   Discussion/Summary 1. I have discussed ureteroscopy and  stone extraction/possible lithotripsy with the patient. We discussed risks and complications  2. The patient will be added onto the evening schedule, with either Dr. Heloise Purpura or Dr. Dwana Curd performing the procedure  3. He might also need balloon dilatation of his urethra secondary to stricture disease.

## 2011-12-29 ENCOUNTER — Encounter (HOSPITAL_COMMUNITY): Payer: Self-pay | Admitting: *Deleted

## 2011-12-29 ENCOUNTER — Other Ambulatory Visit: Payer: Self-pay | Admitting: Urology

## 2011-12-30 ENCOUNTER — Ambulatory Visit (HOSPITAL_COMMUNITY): Payer: BC Managed Care – PPO

## 2011-12-30 ENCOUNTER — Encounter (HOSPITAL_COMMUNITY): Payer: Self-pay | Admitting: Anesthesiology

## 2011-12-30 ENCOUNTER — Encounter (HOSPITAL_COMMUNITY): Payer: Self-pay | Admitting: *Deleted

## 2011-12-30 ENCOUNTER — Encounter (HOSPITAL_COMMUNITY)
Admission: RE | Disposition: A | Discharge status: Home / Self Care | Payer: Self-pay | Source: Ambulatory Visit | Attending: Urology

## 2011-12-30 ENCOUNTER — Ambulatory Visit (HOSPITAL_COMMUNITY)
Admission: RE | Admit: 2011-12-30 | Discharge: 2011-12-30 | Disposition: A | Discharge status: Home / Self Care | Payer: BC Managed Care – PPO | Source: Ambulatory Visit | Attending: Urology | Admitting: Urology

## 2011-12-30 ENCOUNTER — Ambulatory Visit (HOSPITAL_COMMUNITY): Payer: BC Managed Care – PPO | Admitting: Anesthesiology

## 2011-12-30 DIAGNOSIS — N35919 Unspecified urethral stricture, male, unspecified site: Secondary | ICD-10-CM | POA: Insufficient documentation

## 2011-12-30 DIAGNOSIS — R39198 Other difficulties with micturition: Secondary | ICD-10-CM | POA: Insufficient documentation

## 2011-12-30 DIAGNOSIS — N360 Urethral fistula: Secondary | ICD-10-CM | POA: Insufficient documentation

## 2011-12-30 DIAGNOSIS — Z79899 Other long term (current) drug therapy: Secondary | ICD-10-CM | POA: Insufficient documentation

## 2011-12-30 HISTORY — PX: CYSTOSCOPY WITH URETHRAL DILATATION: SHX5125

## 2011-12-30 LAB — BASIC METABOLIC PANEL
BUN: 12 mg/dL (ref 6–23)
Calcium: 9.5 mg/dL (ref 8.4–10.5)
Chloride: 103 mEq/L (ref 96–112)
Creatinine, Ser: 1.04 mg/dL (ref 0.50–1.35)
GFR calc Af Amer: >90 mL/min (ref >90)
GFR calc non Af Amer: 84 mL/min — ABNORMAL LOW (ref >90)

## 2011-12-30 LAB — SURGICAL PCR SCREEN
MRSA, PCR: NEGATIVE
Staphylococcus aureus: NEGATIVE

## 2011-12-30 LAB — CBC
Hemoglobin: 14.3 g/dL (ref 13.0–17.0)
MCH: 29.8 pg (ref 26.0–34.0)
MCV: 84 fL (ref 78.0–100.0)
Platelets: 245 10*3/uL (ref 150–400)
RBC: 4.8 MIL/uL (ref 4.22–5.81)
RDW: 12.7 % (ref 11.5–15.5)

## 2011-12-30 SURGERY — CYSTOSCOPY, WITH URETHRAL DILATION
Anesthesia: General

## 2011-12-30 MED ORDER — PROPOFOL 10 MG/ML IV BOLUS
INTRAVENOUS | Status: DC | PRN
  Administered 2011-12-30: 160 mg via INTRAVENOUS

## 2011-12-30 MED ORDER — FENTANYL CITRATE 0.05 MG/ML IJ SOLN: 25.0000 ug | INTRAMUSCULAR | Status: DC | PRN

## 2011-12-30 MED ORDER — HYDROCODONE-ACETAMINOPHEN 5-325 MG PO TABS
1.0000 | ORAL_TABLET | ORAL | Status: DC | PRN
  Administered 2011-12-30: 1 via ORAL
  Filled 2011-12-30: qty 1

## 2011-12-30 MED ORDER — MIDAZOLAM HCL 5 MG/5ML IJ SOLN: INTRAMUSCULAR | Status: DC | PRN

## 2011-12-30 MED ORDER — CIPROFLOXACIN IN D5W 400 MG/200ML IV SOLN
INTRAVENOUS | Status: AC
  Filled 2011-12-30: qty 200

## 2011-12-30 MED ORDER — CIPROFLOXACIN HCL 250 MG PO TABS: 250.0000 mg | ORAL_TABLET | Freq: Two times a day (BID) | ORAL | Status: DC

## 2011-12-30 MED ORDER — ACETAMINOPHEN 10 MG/ML IV SOLN
INTRAVENOUS | Status: AC
  Filled 2011-12-30: qty 100

## 2011-12-30 MED ORDER — HYDROCODONE-ACETAMINOPHEN 5-500 MG PO TABS: 1.0000 | ORAL_TABLET | Freq: Four times a day (QID) | ORAL | Status: DC | PRN

## 2011-12-30 MED ORDER — CIPROFLOXACIN IN D5W 400 MG/200ML IV SOLN
400.0000 mg | INTRAVENOUS | Status: AC
  Administered 2011-12-30: 400 mg via INTRAVENOUS

## 2011-12-30 MED ORDER — DEXAMETHASONE SODIUM PHOSPHATE 10 MG/ML IJ SOLN
INTRAMUSCULAR | Status: DC | PRN
  Administered 2011-12-30: 10 mg via INTRAVENOUS

## 2011-12-30 MED ORDER — MIDAZOLAM HCL 5 MG/5ML IJ SOLN
INTRAMUSCULAR | Status: DC | PRN
  Administered 2011-12-30: 2 mg via INTRAVENOUS

## 2011-12-30 MED ORDER — ACETAMINOPHEN 10 MG/ML IV SOLN
INTRAVENOUS | Status: DC | PRN
  Administered 2011-12-30: 1000 mg via INTRAVENOUS

## 2011-12-30 MED ORDER — SODIUM CHLORIDE 0.9 % IR SOLN
Status: DC | PRN
  Administered 2011-12-30: 3000 mL via INTRAVESICAL

## 2011-12-30 MED ORDER — ONDANSETRON HCL 4 MG/2ML IJ SOLN
INTRAMUSCULAR | Status: DC | PRN
  Administered 2011-12-30: 4 mg via INTRAVENOUS

## 2011-12-30 MED ORDER — 0.9 % SODIUM CHLORIDE (POUR BTL) OPTIME
TOPICAL | Status: DC | PRN
  Administered 2011-12-30: 1000 mL

## 2011-12-30 MED ORDER — FENTANYL CITRATE 0.05 MG/ML IJ SOLN
INTRAMUSCULAR | Status: DC | PRN
  Administered 2011-12-30 (×2): 25 ug via INTRAVENOUS
  Administered 2011-12-30: 100 ug via INTRAVENOUS

## 2011-12-30 MED ORDER — IOHEXOL 300 MG/ML  SOLN
INTRAMUSCULAR | Status: DC | PRN
  Administered 2011-12-30: 50 mL

## 2011-12-30 MED ORDER — PROMETHAZINE HCL 25 MG/ML IJ SOLN: 6.2500 mg | INTRAMUSCULAR | Status: DC | PRN

## 2011-12-30 MED ORDER — MUPIROCIN 2 % EX OINT
TOPICAL_OINTMENT | Freq: Two times a day (BID) | CUTANEOUS | Status: DC
  Administered 2011-12-30: 1 via NASAL
  Filled 2011-12-30: qty 22

## 2011-12-30 MED ORDER — LACTATED RINGERS IV SOLN
INTRAVENOUS | Status: DC
  Administered 2011-12-30: 14:00:00 via INTRAVENOUS
  Administered 2011-12-30: 1000 mL via INTRAVENOUS

## 2011-12-30 SURGICAL SUPPLY — 12 items
BAG URO CATCHER STRL LF (DRAPE) ×2 IMPLANT
BALLN NEPHROSTOMY (BALLOONS) ×2
BALLOON NEPHROSTOMY (BALLOONS) ×1 IMPLANT
CLOTH BEACON ORANGE TIMEOUT ST (SAFETY) ×2 IMPLANT
DRAPE C-ARM 42X72 X-RAY (DRAPES) ×2 IMPLANT
GLOVE BIO SURGEON STRL SZ7.5 (GLOVE) ×2 IMPLANT
GLOVE BIOGEL M STRL SZ7.5 (GLOVE) ×2 IMPLANT
GOWN PREVENTION PLUS XLARGE (GOWN DISPOSABLE) ×2 IMPLANT
GOWN STRL REIN XL XLG (GOWN DISPOSABLE) ×2 IMPLANT
HOLDER FOLEY CATH W/STRAP (MISCELLANEOUS) IMPLANT
PACK CYSTO (CUSTOM PROCEDURE TRAY) ×2 IMPLANT
TUBING CONNECTING 10 (TUBING) ×2 IMPLANT

## 2011-12-30 NOTE — H&P (Signed)
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 He has a distal stricture. A few days ago, he started straining when he urinated. It feels like the flap again in the urethra. It gets a little bit slow, but he was actually doing quite well. He has had a stone in the past and several months ago he was dilated with a scope followed by ureteroscopy by Dr. Laverle [name].  He says he is straining and he has dribbling onto his scrotum.   He took some Cipro empirically. There is no other modifying factors or associated signs or symptoms. There is no other aggravating or relieving factors. The symptoms are significant in severity and persisting.  Review of Systems: No change in bowel or neurologic systems.   Today he underwent a number of tests, which I personally reviewed.   Uroflowmetry: He voided 17 mL with a maximum flow of 2 mL/sec.     Bladder scan residual was 18 mL.      Past Medical History Problems  1. History of  Arthritis V13.4  Surgical History Problems  1. 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 History of  Cystoscopy For Urethral Stricture 3. History of  Cystoscopy For Urethral Stricture 4. History of  Cystoscopy For Urethral Stricture 5. History of  Cystoscopy For Urethral Stricture 6. History of  Cystoscopy For Urethral Stricture 7. History of  Cystoscopy With Insertion Of Ureteral Stent Left 8. History of  Cystoscopy With Ureteroscopy With Removal Of Calculus 9. History of  Neck Surgery 10. History of  Surgery Penis Repair Of Hypospadias Complications  Current Meds 1. Maxalt TABS; Therapy: (Recorded:21May2008) to 2. Oxycodone-Acetaminophen 5-325 MG Oral Tablet; Therapy: (Recorded:18Jun2013) to 3. Sulfamethoxazole-TMP DS 800-160 MG Oral Tablet; Therapy: (Recorded:18Jun2013) to 4. Tamsulosin HCl 0.4 MG Oral Capsule; Therapy: (Recorded:18Jun2013) to  Allergies Medication  1. No Known Drug  Allergies  Family History Problems  1. Family history of  Family Health Status Number Of Children 1 DAUGHTER 2. Family history of  Hematuria 3. Family history of  Renal Failure  Social History Problems  1. Alcohol Use 0.7 PPD 2. Caffeine Use 5 PPD 3. Marital History - Currently Married 4. Never A Smoker  Results/Data Urine [Data Includes: Last 1 Day]   23Dec2013  COLOR YELLOW   APPEARANCE CLEAR   SPECIFIC GRAVITY >1.030   pH 5.5   GLUCOSE 100 mg/dL  BILIRUBIN NEG   KETONE NEG mg/dL  BLOOD NEG   PROTEIN NEG mg/dL  UROBILINOGEN 0.2 mg/dL  NITRITE NEG   LEUKOCYTE ESTERASE NEG    Assessment Assessed  1. Urinary Stream Is Smaller 788.62 2. Urethral Stricture 598.9  Plan Health Maintenance (V70.0)  1. UA With REFLEX  Done: 23Dec2013 04:19PM  Discussion/Summary  [id number] [Recorded 12/29/11 04:24 PM EST by [email or web address]]    [name] likely has a recurrent stricture. He is not a patient that I can cystoscope. He will not do self catheterization. He has pan-urethral stricture disease. I reviewed my last note. He will be booked for cystoscopy, retrograde urethrogram and balloon dilation.  We talked about balloon dilation in detail. Pros, cons, general surgical and anesthetic risks, and other options including watchful waiting were discussed. He understands that dilation is generally successful in most cases but long-term success rates are low. We talked about the risk of persistent, de novo, or worsening incontinence and voiding dysfunction. Risks were described but not limited to the discussion of injury to neighboring  structures and soft tissues. Bleeding, infection, pain, erectile dysfunction, and spraying of urination were discussed. The risk of neuropathy was discussed as well as the usual post-operative course.  After a thorough review of the management options for the patient's condition the patient  elected to proceed with surgical therapy as noted above. We  have discussed the potential benefits and risks of the procedure, side effects of the proposed treatment, the likelihood of the patient achieving the goals of the procedure, and any potential problems that might occur during the procedure or recuperation. Informed consent has been obtained.

## 2011-12-30 NOTE — Anesthesia Postprocedure Evaluation (Signed)
  Anesthesia Post-op Note  Patient: [name]  Procedure(s) Performed: Procedure(s) (LRB): CYSTOSCOPY WITH URETHRAL DILATATION (N/A)  Patient Location: PACU  Anesthesia Type: General  Level of Consciousness: awake and alert   Airway and Oxygen Therapy: Patient Spontanous Breathing  Post-op Pain: mild  Post-op Assessment: Post-op Vital signs reviewed, Patient's Cardiovascular Status Stable, Respiratory Function Stable, Patent Airway and No signs of Nausea or vomiting  Last Vitals:  Filed Vitals:   12/30/11 1430  BP: 117/87  Pulse: 65  Temp:   Resp: 10    Post-op Vital Signs: stable   Complications: No apparent anesthesia complications

## 2011-12-30 NOTE — Transfer of Care (Signed)
Immediate Anesthesia Transfer of Care Note  Patient: [name]  Procedure(s) Performed: Procedure(s) (LRB) with comments: CYSTOSCOPY WITH URETHRAL DILATATION (N/A) - CYSTO, BALLOON DILATION, RETROGRADE URETHROGRAM  Patient Location: PACU  Anesthesia Type:General  Level of Consciousness: awake, alert , oriented and patient cooperative  Airway & Oxygen Therapy: Patient Spontanous Breathing and Patient connected to face mask oxygen  Post-op Assessment: Report given to PACU RN and Post -op Vital signs reviewed and stable  Post vital signs: Reviewed and stable  Complications: No apparent anesthesia complications

## 2011-12-30 NOTE — Progress Notes (Signed)
Dr  Sherron Monday stated he saw patient in PACU and may go home when ready for DC in [facility]

## 2011-12-30 NOTE — Interval H&P Note (Signed)
History and Physical Interval Note:  12/30/2011 7:20 AM  [name]  has presented today for surgery, with the diagnosis of URETHRAL STRICTURE  The various methods of treatment have been discussed with the patient and family. After consideration of risks, benefits and other options for treatment, the patient has consented to  Procedure(s) (LRB) with comments: CYSTOSCOPY WITH URETHRAL DILATATION (N/A) - CYSTO, BALLOON DILATION, RETROGRADE URETHROGRAM as a surgical intervention .  The patient's history has been reviewed, patient examined, no change in status, stable for surgery.  I have reviewed the patient's chart and labs.  Questions were answered to the patient's satisfaction.     Megan Presti A

## 2011-12-30 NOTE — Preoperative (Signed)
Beta Blockers   Reason not to administer Beta Blockers:Not Applicable 

## 2011-12-30 NOTE — Op Note (Signed)
Preoperative diagnosis: Urethral stricture Postoperative diagnosis: Urethral stricture Surgery: Cystoscopy, retrograde urethrogram, balloon dilation urethral stricture Surgeon: Dr. Lorin Picket Mande Auvil  The patient consented the above procedure with the above diagnoses. In the AP lithotomy position after giving IV antibiotics identified the subcoronal hypospadias and did a retrograde urethrogram  Retrograde urethrogram: With a 1 inch cut its [place] ureteral catheter the retrograde ureterogram and I wouldn't rush into the bladder. His bulbar urethra looked reasonable. Is no question he had a recurrent stricture distally approxi-1 inch proximal to the meatus.  Under fluoroscopic guidance I passed a sensor wire in the bladder. I passed the balloon dilation catheter well-prepared to the proximal urethra and prostatic urethra just exiting the meatus.  The balloon dilated for 5 minutes under 18 atmospheres of pressure. I deflated the balloon and removed the balloon. I cystoscoped the 45 [place] scope. Penile and bulbar urethra open. He panurethral stricture disease. I did not identify the fistula this time. He grade 1 and a 4 bladder trabeculation. Trigone was normal. There was no bleeding. Catheter was not inserted

## 2011-12-30 NOTE — Anesthesia Preprocedure Evaluation (Signed)
Anesthesia Evaluation  Patient identified by MRN, date of birth, ID band Patient awake    Reviewed: Allergy & Precautions, H&P , NPO status , Patient's Chart, lab work & pertinent test results, reviewed documented beta blocker date and time   Airway Mallampati: II TM Distance: >3 FB Neck ROM: full    Dental No notable dental hx.    Pulmonary neg pulmonary ROS,  breath sounds clear to auscultation  Pulmonary exam normal       Cardiovascular Exercise Tolerance: Good negative cardio ROS  Rhythm:regular Rate:Normal     Neuro/Psych  Headaches, negative neurological ROS  negative psych ROS   GI/Hepatic negative GI ROS, Neg liver ROS,   Endo/Other  negative endocrine ROS  Renal/GU Renal diseasenegative Renal ROS  negative genitourinary   Musculoskeletal   Abdominal   Peds  Hematology negative hematology ROS (+)   Anesthesia Other Findings   Reproductive/Obstetrics negative OB ROS                           Anesthesia Physical Anesthesia Plan  ASA: II  Anesthesia Plan: General   Post-op Pain Management:    Induction:   Airway Management Planned:   Additional Equipment:   Intra-op Plan:   Post-operative Plan:   Informed Consent: I have reviewed the patients History and Physical, chart, labs and discussed the procedure including the risks, benefits and alternatives for the proposed anesthesia with the patient or authorized representative who has indicated his/her understanding and acceptance.   Dental Advisory Given  Plan Discussed with: CRNA  Anesthesia Plan Comments:         Anesthesia Quick Evaluation

## 2011-12-30 NOTE — Progress Notes (Signed)
Pt has a head cold. Lungs clear 

## 2012-01-01 ENCOUNTER — Encounter (HOSPITAL_COMMUNITY): Payer: Self-pay | Admitting: Urology

## 2012-05-03 ENCOUNTER — Encounter (HOSPITAL_COMMUNITY): Payer: Self-pay | Admitting: Pharmacy Technician

## 2012-05-03 ENCOUNTER — Encounter (HOSPITAL_COMMUNITY): Payer: Self-pay | Admitting: *Deleted

## 2012-05-03 ENCOUNTER — Other Ambulatory Visit: Payer: Self-pay | Admitting: Urology

## 2012-05-03 NOTE — Progress Notes (Signed)
Spoke with Chryl Heck, scheduling, and notified her that patient wants to be same day status labs- states didn't come before for PST visit

## 2012-05-05 NOTE — Progress Notes (Signed)
Dr Sherron Monday-  Need pre op orders please.  THANK YOU

## 2012-05-05 NOTE — Progress Notes (Signed)
Faxed request to Dr Sherron Monday for pre op orders thru [facility], also left voice mail with Jolyn Lent, schedular that signed orders are needed. Spoke with patients wife, DEBBIE, who stated they are aware of surgery change to 05/06/12 and to arrive at short stay at 0530. Reminded her NPO after midnight.  Instructed her to have husband take first BETASEPT shower tonight, and one in the morning. Verbalized understanding

## 2012-05-06 ENCOUNTER — Encounter (HOSPITAL_COMMUNITY): Payer: Self-pay | Admitting: Anesthesiology

## 2012-05-06 ENCOUNTER — Encounter (HOSPITAL_COMMUNITY): Payer: Self-pay

## 2012-05-06 ENCOUNTER — Ambulatory Visit (HOSPITAL_COMMUNITY)
Admission: RE | Admit: 2012-05-06 | Discharge: 2012-05-06 | Disposition: A | Discharge status: Home / Self Care | Payer: BC Managed Care – PPO | Source: Ambulatory Visit | Attending: Urology | Admitting: Urology

## 2012-05-06 ENCOUNTER — Ambulatory Visit (HOSPITAL_COMMUNITY): Payer: BC Managed Care – PPO | Admitting: Anesthesiology

## 2012-05-06 ENCOUNTER — Encounter (HOSPITAL_COMMUNITY)
Admission: RE | Disposition: A | Discharge status: Home / Self Care | Payer: Self-pay | Source: Ambulatory Visit | Attending: Urology

## 2012-05-06 DIAGNOSIS — R351 Nocturia: Secondary | ICD-10-CM | POA: Insufficient documentation

## 2012-05-06 DIAGNOSIS — N35919 Unspecified urethral stricture, male, unspecified site: Secondary | ICD-10-CM | POA: Insufficient documentation

## 2012-05-06 DIAGNOSIS — N289 Disorder of kidney and ureter, unspecified: Secondary | ICD-10-CM | POA: Insufficient documentation

## 2012-05-06 DIAGNOSIS — Z86711 Personal history of pulmonary embolism: Secondary | ICD-10-CM | POA: Insufficient documentation

## 2012-05-06 DIAGNOSIS — M129 Arthropathy, unspecified: Secondary | ICD-10-CM | POA: Insufficient documentation

## 2012-05-06 DIAGNOSIS — Q549 Hypospadias, unspecified: Secondary | ICD-10-CM | POA: Insufficient documentation

## 2012-05-06 DIAGNOSIS — N200 Calculus of kidney: Secondary | ICD-10-CM | POA: Insufficient documentation

## 2012-05-06 HISTORY — PX: CYSTOSCOPY WITH URETHRAL DILATATION: SHX5125

## 2012-05-06 HISTORY — DX: Other seasonal allergic rhinitis: J30.2

## 2012-05-06 LAB — BASIC METABOLIC PANEL
Chloride: 101 mEq/L (ref 96–112)
GFR calc Af Amer: >90 mL/min (ref >90)
GFR calc non Af Amer: 83 mL/min — ABNORMAL LOW (ref >90)
Glucose, Bld: 134 mg/dL — ABNORMAL HIGH (ref 70–99)
Potassium: 4 mEq/L (ref 3.5–5.1)
Sodium: 139 mEq/L (ref 135–145)

## 2012-05-06 LAB — CBC
Hemoglobin: 14.9 g/dL (ref 13.0–17.0)
MCHC: 35.5 g/dL (ref 30.0–36.0)
RDW: 12.7 % (ref 11.5–15.5)
WBC: 6.8 10*3/uL (ref 4.0–10.5)

## 2012-05-06 LAB — SURGICAL PCR SCREEN
MRSA, PCR: NEGATIVE
Staphylococcus aureus: NEGATIVE

## 2012-05-06 SURGERY — CYSTOSCOPY, WITH URETHRAL DILATION
Anesthesia: General | Wound class: Clean Contaminated

## 2012-05-06 MED ORDER — IOHEXOL 300 MG/ML  SOLN
INTRAMUSCULAR | Status: DC | PRN
  Administered 2012-05-06: 20 mL

## 2012-05-06 MED ORDER — LACTATED RINGERS IV SOLN
INTRAVENOUS | Status: DC | PRN
  Administered 2012-05-06 (×2): via INTRAVENOUS

## 2012-05-06 MED ORDER — MEPERIDINE HCL 50 MG/ML IJ SOLN: 6.2500 mg | INTRAMUSCULAR | Status: DC | PRN

## 2012-05-06 MED ORDER — ONDANSETRON HCL 4 MG/2ML IJ SOLN
INTRAMUSCULAR | Status: DC | PRN
  Administered 2012-05-06: 4 mg via INTRAVENOUS

## 2012-05-06 MED ORDER — ACETAMINOPHEN 10 MG/ML IV SOLN
INTRAVENOUS | Status: AC
  Filled 2012-05-06: qty 100

## 2012-05-06 MED ORDER — CIPROFLOXACIN IN D5W 400 MG/200ML IV SOLN: 400.0000 mg | INTRAVENOUS | Status: DC

## 2012-05-06 MED ORDER — CIPROFLOXACIN IN D5W 400 MG/200ML IV SOLN
INTRAVENOUS | Status: AC
  Filled 2012-05-06: qty 200

## 2012-05-06 MED ORDER — CIPROFLOXACIN HCL 250 MG PO TABS: 250.0000 mg | ORAL_TABLET | Freq: Two times a day (BID) | ORAL | Status: DC

## 2012-05-06 MED ORDER — LIDOCAINE HCL (CARDIAC) 20 MG/ML IV SOLN
INTRAVENOUS | Status: DC | PRN
  Administered 2012-05-06: 50 mg via INTRAVENOUS

## 2012-05-06 MED ORDER — ACETAMINOPHEN 10 MG/ML IV SOLN: 1000.0000 mg | Freq: Once | INTRAVENOUS | Status: DC | PRN

## 2012-05-06 MED ORDER — OXYCODONE HCL 5 MG PO TABS: 5.0000 mg | ORAL_TABLET | Freq: Once | ORAL | Status: DC | PRN

## 2012-05-06 MED ORDER — PROPOFOL 10 MG/ML IV BOLUS
INTRAVENOUS | Status: DC | PRN
  Administered 2012-05-06: 200 mg via INTRAVENOUS

## 2012-05-06 MED ORDER — LIDOCAINE HCL 2 % EX GEL
CUTANEOUS | Status: AC
  Filled 2012-05-06: qty 10

## 2012-05-06 MED ORDER — STERILE WATER FOR IRRIGATION IR SOLN
Status: DC | PRN
  Administered 2012-05-06: 3000 mL

## 2012-05-06 MED ORDER — MIDAZOLAM HCL 5 MG/5ML IJ SOLN
INTRAMUSCULAR | Status: DC | PRN
  Administered 2012-05-06: 2 mg via INTRAVENOUS

## 2012-05-06 MED ORDER — LACTATED RINGERS IV SOLN: INTRAVENOUS | Status: DC

## 2012-05-06 MED ORDER — MUPIROCIN 2 % EX OINT
TOPICAL_OINTMENT | Freq: Two times a day (BID) | CUTANEOUS | Status: DC
  Administered 2012-05-06: 06:00:00 via NASAL
  Filled 2012-05-06: qty 22

## 2012-05-06 MED ORDER — HYDROMORPHONE HCL PF 1 MG/ML IJ SOLN: 0.2500 mg | INTRAMUSCULAR | Status: DC | PRN

## 2012-05-06 MED ORDER — OXYCODONE HCL 5 MG/5ML PO SOLN
5.0000 mg | Freq: Once | ORAL | Status: DC | PRN
  Filled 2012-05-06: qty 5

## 2012-05-06 MED ORDER — ACETAMINOPHEN 10 MG/ML IV SOLN
INTRAVENOUS | Status: DC | PRN
  Administered 2012-05-06: 1000 mg via INTRAVENOUS

## 2012-05-06 MED ORDER — 0.9 % SODIUM CHLORIDE (POUR BTL) OPTIME
TOPICAL | Status: DC | PRN
  Administered 2012-05-06: 1000 mL

## 2012-05-06 MED ORDER — FENTANYL CITRATE 0.05 MG/ML IJ SOLN
INTRAMUSCULAR | Status: DC | PRN
  Administered 2012-05-06 (×2): 50 ug via INTRAVENOUS

## 2012-05-06 MED ORDER — IOHEXOL 300 MG/ML  SOLN
INTRAMUSCULAR | Status: AC
  Filled 2012-05-06: qty 1

## 2012-05-06 MED ORDER — CIPROFLOXACIN IN D5W 400 MG/200ML IV SOLN
INTRAVENOUS | Status: DC | PRN
  Administered 2012-05-06: 400 mg via INTRAVENOUS

## 2012-05-06 MED ORDER — PROMETHAZINE HCL 25 MG/ML IJ SOLN: 6.2500 mg | INTRAMUSCULAR | Status: DC | PRN

## 2012-05-06 SURGICAL SUPPLY — 13 items
BAG URO CATCHER STRL LF (DRAPE) ×2 IMPLANT
BALLN NEPHROSTOMY (BALLOONS) ×2
BALLOON NEPHROSTOMY (BALLOONS) ×1 IMPLANT
CATH URET 5FR 28IN OPEN ENDED (CATHETERS) ×2 IMPLANT
CLOTH BEACON ORANGE TIMEOUT ST (SAFETY) ×2 IMPLANT
GLOVE BIO SURGEON STRL SZ7.5 (GLOVE) ×2 IMPLANT
GLOVE BIOGEL M STRL SZ7.5 (GLOVE) ×2 IMPLANT
GOWN PREVENTION PLUS XLARGE (GOWN DISPOSABLE) ×2 IMPLANT
GOWN STRL REIN XL XLG (GOWN DISPOSABLE) ×2 IMPLANT
GUIDEWIRE STR DUAL SENSOR (WIRE) ×2 IMPLANT
HOLDER FOLEY CATH W/STRAP (MISCELLANEOUS) IMPLANT
PACK CYSTO (CUSTOM PROCEDURE TRAY) ×2 IMPLANT
TUBING CONNECTING 10 (TUBING) ×2 IMPLANT

## 2012-05-06 NOTE — Anesthesia Postprocedure Evaluation (Signed)
Anesthesia Post Note  Patient: [name]  Procedure(s) Performed: Procedure(s) (LRB): CYSTOSCOPY WITH BALLOON DILATATION (N/A) CYSTOSCOPY WITH RETROGRADE URETHROGRAM (N/A)  Anesthesia type: General  Patient location: PACU  Post pain: Pain level controlled  Post assessment: Post-op Vital signs reviewed  Last Vitals: BP 118/85  Pulse 64  Temp(Src) 36.4 C (Oral)  Resp 16  Ht 5\' 11"  (1.803 m)  Wt 181 lb 8 oz (82.328 kg)  BMI 25.33 kg/m2  SpO2 99%  Post vital signs: Reviewed  Level of consciousness: sedated  Complications: No apparent anesthesia complications

## 2012-05-06 NOTE — Progress Notes (Signed)
Unable to draw blood work. Patient refuses to be stuck again. Call to [facility] in anesthesia and  He said to bring patient on over to holding and he will start IV and draw blood

## 2012-05-06 NOTE — Transfer of Care (Signed)
Immediate Anesthesia Transfer of Care Note  Patient: [name]  Procedure(s) Performed: Procedure(s): CYSTOSCOPY WITH BALLOON DILATATION (N/A) CYSTOSCOPY WITH RETROGRADE URETHROGRAM (N/A)  Patient Location: PACU  Anesthesia Type:General  Level of Consciousness: awake, alert  and oriented  Airway & Oxygen Therapy: Patient Spontanous Breathing and Patient connected to face mask oxygen  Post-op Assessment: Report given to PACU RN and Post -op Vital signs reviewed and stable  Post vital signs: Reviewed and stable  Complications: No apparent anesthesia complications

## 2012-05-06 NOTE — Anesthesia Preprocedure Evaluation (Addendum)
Anesthesia Evaluation  Patient identified by MRN, date of birth, ID band Patient awake    Reviewed: Allergy & Precautions, H&P , NPO status , Patient's Chart, lab work & pertinent test results, reviewed documented beta blocker date and time   Airway Mallampati: II TM Distance: >3 FB Neck ROM: full    Dental no notable dental hx. (+) Dental Advisory Given   Pulmonary PE breath sounds clear to auscultation  Pulmonary exam normal       Cardiovascular Exercise Tolerance: Good negative cardio ROS  Rhythm:regular Rate:Normal     Neuro/Psych  Headaches, negative neurological ROS  negative psych ROS   GI/Hepatic negative GI ROS, Neg liver ROS,   Endo/Other  negative endocrine ROS  Renal/GU Renal disease     Musculoskeletal   Abdominal   Peds  Hematology negative hematology ROS (+)   Anesthesia Other Findings   Reproductive/Obstetrics negative OB ROS                          Anesthesia Physical  Anesthesia Plan  ASA: II  Anesthesia Plan: General   Post-op Pain Management:    Induction: Intravenous  Airway Management Planned: LMA  Additional Equipment:   Intra-op Plan:   Post-operative Plan: Extubation in OR  Informed Consent: I have reviewed the patients History and Physical, chart, labs and discussed the procedure including the risks, benefits and alternatives for the proposed anesthesia with the patient or authorized representative who has indicated his/her understanding and acceptance.   Dental advisory given  Plan Discussed with: CRNA  Anesthesia Plan Comments:         Anesthesia Quick Evaluation

## 2012-05-06 NOTE — H&P (Signed)
History of Present Illness   Mr. Bundren has complex urethral stricture disease. He has a urethral ___ fistula. He does not want to perform self-catheterization or consider major reconstructive urethroplasty for pan-urethral stricture disease.   In the last 2 weeks, his flow was slower. He stains some. He can double void an equal amount. He is having no pain. He has intermittent discomfort in the left flank. He has no fever. The discomfort is mild and comes and goes.  He has stable nocturia. He did not get the Masters and Johnson's book for ongoing stable ejaculatory issues. He has never been given medication for this problem.  His last urine cultures were normal. He had a 1 mm stone in his kidney years ago.   There is no other modifying factors or associated signs or symptoms. There is no other aggravating or relieving factors. The symptoms are moderate in severity but getting worse over time.  Today he underwent a number of tests, which I personally reviewed.  Uroflowmetry: He voided 75 mL with a maximum flow of 20 mL/sec but this was due to a straining pattern.  Bladder Scan: Bladder scan residual was 42 mL.  Mr. Mizzell was not toxic. He had no CVA tenderness.    Past Medical History Problems  1. History of  Arthritis V13.4  Surgical History Problems  1. 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 History of  Cystoscopy For Urethral Stricture 3. History of  Cystoscopy For Urethral Stricture 4. History of  Cystoscopy For Urethral Stricture 5. History of  Cystoscopy For Urethral Stricture 6. History of  Cystoscopy For Urethral Stricture 7. History of  Cystoscopy For Urethral Stricture 8. History of  Cystoscopy With Insertion Of Ureteral Stent Left 9. History of  Cystoscopy With Ureteroscopy With Removal Of Calculus 10. History of  Neck Surgery 11. History of  Surgery Penis Repair Of Hypospadias Complications  Current Meds 1. Maxalt TABS; Therapy: (Recorded:21May2008)  to 2. Oxycodone-Acetaminophen 5-325 MG Oral Tablet; Therapy: (Recorded:18Jun2013) to  Allergies Medication  1. No Known Drug Allergies  Family History Problems  1. Family history of  Family Health Status Number Of Children 1 DAUGHTER 2. Family history of  Hematuria 3. Family history of  Renal Failure  Social History Problems  1. Alcohol Use 0.7 PPD 2. Caffeine Use 5 PPD 3. Marital History - Currently Married 4. Never A Smoker  Vitals Vital Signs [Data Includes: Last 1 Day]  24Apr2014 03:40PM  BMI Calculated: 26.18 BSA Calculated: 2.05 Weight: 187 lb  Blood Pressure: 130 / 92 Temperature: 98 F Heart Rate: 80  Results/Data Urine [Data Includes: Last 1 Day]   24Apr2014  COLOR YELLOW   APPEARANCE CLEAR   SPECIFIC GRAVITY 1.025   pH 6.0   GLUCOSE NEG mg/dL  BILIRUBIN NEG   KETONE NEG mg/dL  BLOOD NEG   PROTEIN NEG mg/dL  UROBILINOGEN 0.2 mg/dL  NITRITE NEG   LEUKOCYTE ESTERASE NEG    Assessment Assessed  1. Urinary Calculus On The Left 592.9 2. Urethral Stricture 598.9  Plan Dysuria (788.1)  1. PVR U/S  Done: 24Apr2014 Health Maintenance (V70.0)  2. UA With REFLEX  Done: 24Apr2014 03:50PM Urinary Stream Is Smaller (788.62)  3. Complex Uroflowmetry  Done: 24Apr2014  Discussion/Summary   Hawk is well versed on the risks of surgery. He is going to proceed with a dilation. I will call if the culture is positive. There was no blood in the urine. Urinalysis looked normal. Clinically, I think it is  unlikely that he has a stone.  After a thorough review of the management options for the patient's condition the patient  elected to proceed with surgical therapy as noted above. We have discussed the potential benefits and risks of the procedure, side effects of the proposed treatment, the likelihood of the patient achieving the goals of the procedure, and any potential problems that might occur during the procedure or recuperation. Informed consent has been  obtained.

## 2012-05-06 NOTE — Interval H&P Note (Signed)
History and Physical Interval Note:  05/06/2012 7:14 AM  [name]  has presented today for surgery, with the diagnosis of URETHRAL STRICTURE  The various methods of treatment have been discussed with the patient and family. After consideration of risks, benefits and other options for treatment, the patient has consented to  Procedure(s): CYSTOSCOPY WITH BALLOON DILATATION (N/A) CYSTOSCOPY WITH RETROGRADE URETHROGRAM (N/A) as a surgical intervention .  The patient's history has been reviewed, patient examined, no change in status, stable for surgery.  I have reviewed the patient's chart and labs.  Questions were answered to the patient's satisfaction.     Charrie Mcconnon A

## 2012-05-06 NOTE — Op Note (Signed)
Preoperative diagnosis: Urethral stricture Postoperative diagnosis: Urethral stricture Surgery: Cystoscopy retrograde urethrogram and balloon dilation Surgeon: Dr. Alfredo Martinez  The patient consented the above procedure with worsening symptoms for the above diagnoses. Preoperative antibodies were given. Preoperative laboratory tests were normal.  Patient's placed in lithotomy position. 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 Using an open-ended ureteral catheter cut to 1 inch in length I did a gentle retrograde ureterogram and dye went along the urethra into the bladder.  Retrograde urethrogram: Patient was in the lithotomy position anterior to posterior films were taken. Approximately 10 cc of contrast was utilized. Bulbar urethra looked normal. Dye reach the bladder. Distal urethra was more difficult to see due to stricture disease.  Utilizing the landmarks I easily placed a sensor wire up into the bladder under fluoroscopic guidance.  I placed a well-prepared well-lubricated balloon dilation catheter just distal to the external sphincter under fluoroscopic guidance and direct vision.  The urethra was balloon dilated at 24 French at 18 cm of water for 5 minutes  Balloon dilation catheter was emptied and removed. I entered the bladder with a 17 French cystoscope. Bladder mucosa and trigone were normal. There is no stitch or foreign body or carcinoma or evidence of cystitis.  Prostatic urethra was normal. Membranous urethra was intact. Verumontanum was identified. The stricture disease start approxi-1 cm distal to the external sphincter. Urethra was at least 24 [place] in size. He had panurethral stricture disease but his obviously dilated stricture was the distal 3-4 cm of urethra. I did not identify fistula. There is no urethral meatal carrying. He has mild hypospadias  There was virtually no bleeding the patient was taken to recover room without a catheter

## 2012-05-07 ENCOUNTER — Encounter (HOSPITAL_COMMUNITY): Payer: Self-pay | Admitting: Urology

## 2012-08-09 ENCOUNTER — Emergency Department (HOSPITAL_COMMUNITY)
Admission: EM | Admit: 2012-08-09 | Discharge: 2012-08-09 | Disposition: A | Discharge status: Home / Self Care | Payer: BC Managed Care – PPO | Attending: Emergency Medicine | Admitting: Emergency Medicine

## 2012-08-09 ENCOUNTER — Encounter (HOSPITAL_COMMUNITY): Payer: Self-pay | Admitting: Emergency Medicine

## 2012-08-09 DIAGNOSIS — Z9889 Other specified postprocedural states: Secondary | ICD-10-CM | POA: Insufficient documentation

## 2012-08-09 DIAGNOSIS — N35919 Unspecified urethral stricture, male, unspecified site: Secondary | ICD-10-CM | POA: Insufficient documentation

## 2012-08-09 DIAGNOSIS — G43909 Migraine, unspecified, not intractable, without status migrainosus: Secondary | ICD-10-CM | POA: Insufficient documentation

## 2012-08-09 DIAGNOSIS — Z86711 Personal history of pulmonary embolism: Secondary | ICD-10-CM | POA: Insufficient documentation

## 2012-08-09 DIAGNOSIS — R339 Retention of urine, unspecified: Secondary | ICD-10-CM | POA: Insufficient documentation

## 2012-08-09 DIAGNOSIS — Z79899 Other long term (current) drug therapy: Secondary | ICD-10-CM | POA: Insufficient documentation

## 2012-08-09 DIAGNOSIS — IMO0002 Reserved for concepts with insufficient information to code with codable children: Secondary | ICD-10-CM

## 2012-08-09 DIAGNOSIS — Z87442 Personal history of urinary calculi: Secondary | ICD-10-CM | POA: Insufficient documentation

## 2012-08-09 LAB — BASIC METABOLIC PANEL
CO2: 21 mEq/L (ref 19–32)
Chloride: 96 mEq/L (ref 96–112)
Creatinine, Ser: 1.05 mg/dL (ref 0.50–1.35)
Potassium: 3.8 mEq/L (ref 3.5–5.1)
Sodium: 132 mEq/L — ABNORMAL LOW (ref 135–145)

## 2012-08-09 LAB — CBC WITH DIFFERENTIAL/PLATELET
Basophils Absolute: 0 10*3/uL (ref 0.0–0.1)
HCT: 40.7 % (ref 39.0–52.0)
Lymphocytes Relative: 10 % — ABNORMAL LOW (ref 12–46)
Monocytes Absolute: 0.5 10*3/uL (ref 0.1–1.0)
Neutro Abs: 11.1 10*3/uL — ABNORMAL HIGH (ref 1.7–7.7)
Neutrophils Relative %: 86 % — ABNORMAL HIGH (ref 43–77)
RDW: 12.7 % (ref 11.5–15.5)
WBC: 12.9 10*3/uL — ABNORMAL HIGH (ref 4.0–10.5)

## 2012-08-09 MED ORDER — SODIUM CHLORIDE 0.9 % IV SOLN: INTRAVENOUS | Status: DC

## 2012-08-09 MED ORDER — MORPHINE SULFATE 2 MG/ML IJ SOLN
2.0000 mg | Freq: Once | INTRAMUSCULAR | Status: AC
  Administered 2012-08-09: 2 mg via INTRAVENOUS
  Filled 2012-08-09: qty 1

## 2012-08-09 NOTE — ED Provider Notes (Signed)
CSN: [id number]     Arrival date & time 08/09/12  1816 History     First MD Initiated Contact with Patient 08/09/12 1848     No chief complaint on file.  (Consider location/radiation/quality/duration/timing/severity/associated sxs/prior Treatment) HPI Pt with history of complicated urethral stricture who has had numerous previous balloon dilatation reports decreased urine stream over the last several days, acutely obstructed around 4:30pm today. Has been unable to void since then. States he is feeling bladder pressure. He also reports feeling a hard lump on the ventral penis and is concerned he may have a renal stone stuck in his ureter. He denies any recent flank pain or hematuria. No vomiting this afternoon.   Past Medical History  Diagnosis Date  . History of pulmonary embolus (PE) POST SURG. 2008  . Chronic urethral stricture HX DILATION   . Frequency of urination     DUE TO URETHRAL STRICTURE  . Urgency of urination   . Dysuria   . kidney calculus   . Headache, classical migraine   . Seasonal allergies    Past Surgical History  Procedure Laterality Date  . Cysto/ retrograde urethrogram/ balloon dilation of urethral stricture  X5 LAST ONE 12-05-2011    FEB 2011/  2009 /  2008  . Cervical fusion  2004    C4 - 7  . Repair urethral stenosis  AS CHILD  . Inguinal hernia repair  FEB 2011    LEFT  . Cystoscopy with urethral dilatation  05/16/2011    Procedure: CYSTOSCOPY WITH URETHRAL DILATATION;  Surgeon: Martina Sinner, MD;  Location: [facility];  Service: Urology;  Laterality: N/A;  BALLOON DILATION AND RETROGRADE   . Cystoscopy/retrograde/ureteroscopy/stone extraction with basket  06/20/2011    Procedure: CYSTOSCOPY/RETROGRADE/URETEROSCOPY/STONE EXTRACTION WITH BASKET;  Surgeon: Sebastian Ache, MD;  Location: [facility];  Service: Urology;  Laterality: Left;  . Balloon dilation  06/20/2011    Procedure: BALLOON DILATION;  Surgeon: Sebastian Ache, MD;  Location: [facility];  Service: Urology;  Laterality: Left;  . Cystoscopy with urethral dilatation  12/30/2011    Procedure: CYSTOSCOPY WITH URETHRAL DILATATION;  Surgeon: Martina Sinner, MD;  Location: [facility];  Service: Urology;  Laterality: N/A;  CYSTO, BALLOON DILATION, RETROGRADE URETHROGRAM  . Cystoscopy with urethral dilatation N/A 05/06/2012    Procedure: CYSTOSCOPY WITH BALLOON DILATATION;  Surgeon: Martina Sinner, MD;  Location: [facility];  Service: Urology;  Laterality: N/A;   History reviewed. No pertinent family history. History  Substance Use Topics  . Smoking status: Never Smoker   . Smokeless tobacco: Never Used  . Alcohol Use: Yes     Comment: OCCASIONAL    Review of Systems All other systems reviewed and are negative except as noted in HPI.   Allergies  Review of patient's allergies indicates no known allergies.  Home Medications   Current Outpatient Rx  Name  Route  Sig  Dispense  Refill  . rizatriptan (MAXALT) 10 MG tablet   Oral   Take 10 mg by mouth as needed. May repeat in 2 hours if needed for migraines         . triamcinolone (NASACORT) 55 MCG/ACT nasal inhaler   Nasal   Place 2 sprays into the nose daily.          BP 142/94  Temp(Src) 98.2 F (36.8 C) (Oral)  Resp 16  SpO2 99% Physical Exam  Nursing note and vitals reviewed. Constitutional: He is oriented to person, place, and time. He  appears well-developed and well-nourished.  HENT:  Head: Normocephalic and atraumatic.  Eyes: EOM are normal. Pupils are equal, round, and reactive to light.  Neck: Normal range of motion. Neck supple.  Cardiovascular: Normal rate, normal heart sounds and intact distal pulses.   Pulmonary/Chest: Effort normal and breath sounds normal.  Abdominal: Bowel sounds are normal. He exhibits no distension. There is tenderness (suprapubic). There is no guarding.  Genitourinary:  No definite hard mass in ureter, more likely scar tissue  Musculoskeletal: Normal range of motion. He  exhibits no edema and no tenderness.  Neurological: He is alert and oriented to person, place, and time. He has normal strength. No cranial nerve deficit or sensory deficit.  Skin: Skin is warm and dry. No rash noted.  Psychiatric: He has a normal mood and affect.    ED Course   Procedures (including critical care time)  Labs Reviewed  CBC WITH DIFFERENTIAL - Abnormal; Notable for the following:    WBC 12.9 (*)    Neutrophils Relative % 86 (*)    Neutro Abs 11.1 (*)    Lymphocytes Relative 10 (*)    All other components within normal limits  BASIC METABOLIC PANEL - Abnormal; Notable for the following:    Sodium 132 (*)    Glucose, Bld 278 (*)    GFR calc non Af Amer 82 (*)    All other components within normal limits   No results found. 1. Urinary retention   2. Chronic urethral stricture     MDM  Pt refuses ED insertion of catheter. States he usually has to go to the OR. Spoke with Dr. Retta Diones who will come evaluate the patient in the ED. Asks for GU cart to be at bedside. He is at [facility] consulting on another patient. States it will be about an hour.   9:21 PM Dr. Retta Diones at bedside to attempt ED foley placement.   Charles B. Bernette Mayers, MD 08/10/12 [phone]

## 2012-08-09 NOTE — Consult Note (Signed)
Urology Consult  CC: Difficulty urinating  HPI:  47 year old male presents to the emergency room with inability to urinate. He has a history of a hypospadias repair as a juvenile. He has had recurrent urethral strictures, necessitating frequent visits to the operating room for anesthetic cystoscopy, the lumen dilation of his urethral strictures. This was most recently performed just over 3 months ago by Dr. Sherron Monday. The patient also underwent cystoscopy in the balloon dilation of the urethral stricture and ureteroscopic stone extraction in June of 2013. The patient over the past few days has had gradual slowing of the urinary stream, difficulty urinating, suprapubic pressure, and is also felt a nodular area in his penis that he worries is a ureteral stone. He presented to the emergency room with worsening symptoms tonight, not being able to urinate. Urologic consultation was requested.  He has had no fever, chills, nausea, vomiting, change in bowel habits, blood in his urine or flank pain.  PMH: Past Medical History  Diagnosis Date  . History of pulmonary embolus (PE) POST SURG. 2008  . Chronic urethral stricture HX DILATION   . Frequency of urination     DUE TO URETHRAL STRICTURE  . Urgency of urination   . Dysuria   . kidney calculus   . Headache, classical migraine   . Seasonal allergies     PSH: Past Surgical History  Procedure Laterality Date  . Cysto/ retrograde urethrogram/ balloon dilation of urethral stricture  X5 LAST ONE 12-05-2011    FEB 2011/  2009 /  2008  . Cervical fusion  2004    C4 - 7  . Repair urethral stenosis  AS CHILD  . Inguinal hernia repair  FEB 2011    LEFT  . Cystoscopy with urethral dilatation  05/16/2011    Procedure: CYSTOSCOPY WITH URETHRAL DILATATION;  Surgeon: Martina Sinner, MD;  Location: [facility];  Service: Urology;  Laterality: N/A;  BALLOON DILATION AND RETROGRADE   . Cystoscopy/retrograde/ureteroscopy/stone extraction  with basket  06/20/2011    Procedure: CYSTOSCOPY/RETROGRADE/URETEROSCOPY/STONE EXTRACTION WITH BASKET;  Surgeon: Sebastian Ache, MD;  Location: [facility];  Service: Urology;  Laterality: Left;  . Balloon dilation  06/20/2011    Procedure: BALLOON DILATION;  Surgeon: Sebastian Ache, MD;  Location: [facility];  Service: Urology;  Laterality: Left;  . Cystoscopy with urethral dilatation  12/30/2011    Procedure: CYSTOSCOPY WITH URETHRAL DILATATION;  Surgeon: Martina Sinner, MD;  Location: [facility];  Service: Urology;  Laterality: N/A;  CYSTO, BALLOON DILATION, RETROGRADE URETHROGRAM  . Cystoscopy with urethral dilatation N/A 05/06/2012    Procedure: CYSTOSCOPY WITH BALLOON DILATATION;  Surgeon: Martina Sinner, MD;  Location: [facility];  Service: Urology;  Laterality: N/A;    Allergies: No Known Allergies  Medications:  (Not in a hospital admission)   Social History: History   Social History  . Marital Status: Married    Spouse Name: N/A    Number of Children: N/A  . Years of Education: N/A   Occupational History  . Not on file.   Social History Main Topics  . Smoking status: Never Smoker   . Smokeless tobacco: Never Used  . Alcohol Use: Yes     Comment: OCCASIONAL  . Drug Use: No  . Sexually Active: Not on file   Other Topics Concern  . Not on file   Social History Narrative  . No narrative on file    Family History: History reviewed. No pertinent family history.  Review  of Systems: Positive: Slow stream, hesitancy, mild incontinence, suprapubic pressure Negative  A further 10 point review of systems was negative except what is listed in the HPI.  Physical Exam: @VITALS2 @ General: No acute distress.  Awake. Head:  Normocephalic.  Atraumatic. ENT:  EOMI.  Mucous membranes moist Neck:  Supple.  No lymphadenopathy. CV:  S1 present. S2 present. Regular rate. Pulmonary: Equal effort bilaterally.  Clear to auscultation bilaterally. Abdomen: Soft. Suprapubic tenderness and  fullness noted. No abdominal tenderness or guarding. Skin:  Normal turgor.  No visible rash. Extremity: No gross deformity of bilateral upper extremities.  No gross deformity of    bilateral lower extremities. Neurologic: Alert. Appropriate mood.  Penis:  Circumcised. Hypospadias meatus with mild meatal stenosis.  No lesions. Scrotum: No lesions.  No ecchymosis.  No erythema. Testicles: Descended bilaterally.  No masses bilaterally. Epididymis: Palpable bilaterally.  Non Tender to palpation.  Studies:  Recent Labs     08/09/12  1946  HGB  14.4  WBC  12.9*  PLT  318    Recent Labs     08/09/12  1947  NA  132*  K  3.8  CL  96  CO2  21  BUN  21  CREATININE  1.05  CALCIUM  10.2  GFRNONAA  82*  GFRAA  >90     No results found for this basename: PT, INR, APTT,  in the last 72 hours   No components found with this basename: ABG,   The patient at first requested operative management of his process emergently. I spoke with the patient and his wife at length, and recommended that we at least try to get a catheter in without dilating his urethra. The patient consented. After sterile prep and drape and using 2% viscous lidocaine, I tried, over top of a sensor-tip guidewire, advancing a 16 [place] council tip catheter. That was unsuccessful. I then used a 12 [place] Foley catheter, passed over the sensor-tip guidewire, to negotiate the distal ureteral stricture and was easily able to get the catheter within the bladder. The balloon was then filled with 10 cc of water and hooked to dependent drainage with a leg bag.  Assessment:  1. Recurrent urethral stricture with retention, now with catheter in place  2. Rule out urolithiasis with a urethral stone  Plan: 1. I will send the patient home with a 12 French Foley catheter and leg bag to drainage  2. He was given sulfa pills for 5 days  3. I will bring him in our office for CT urogram to evaluate for calculus disease  4. We will also  see about performing cystoscopy, repeat balloon dilation and, if necessary, stone extraction in the near future. I spoke at length with the patient and his wife about the importance of possible urethral dilation with self-catheterization on a regular basis. I think he is willing to try that.  Cc: Dr. Bernette Mayers  Pager:[phone]

## 2012-08-09 NOTE — ED Notes (Signed)
Pt here unable to void due urinary rentention brought on by ureter sticture

## 2012-08-09 NOTE — ED Notes (Signed)
Urology at bedside.

## 2012-08-09 NOTE — ED Notes (Signed)
Bladder scan revealed 445 ml in bladder.

## 2012-08-10 ENCOUNTER — Other Ambulatory Visit: Payer: Self-pay | Admitting: Urology

## 2012-08-10 ENCOUNTER — Encounter (HOSPITAL_BASED_OUTPATIENT_CLINIC_OR_DEPARTMENT_OTHER): Payer: Self-pay | Admitting: *Deleted

## 2012-08-10 DIAGNOSIS — R109 Unspecified abdominal pain: Secondary | ICD-10-CM

## 2012-08-10 NOTE — Progress Notes (Signed)
To [facility] at 0730-Istat on arrival-Npo after Mn-may take pain medication as needed w/small amt water.

## 2012-08-11 ENCOUNTER — Ambulatory Visit (HOSPITAL_COMMUNITY)
Admission: RE | Admit: 2012-08-11 | Discharge: 2012-08-11 | Disposition: A | Discharge status: Home / Self Care | Payer: BC Managed Care – PPO | Source: Ambulatory Visit | Attending: Urology | Admitting: Urology

## 2012-08-11 DIAGNOSIS — N200 Calculus of kidney: Secondary | ICD-10-CM | POA: Insufficient documentation

## 2012-08-11 DIAGNOSIS — N210 Calculus in bladder: Secondary | ICD-10-CM | POA: Insufficient documentation

## 2012-08-11 DIAGNOSIS — R109 Unspecified abdominal pain: Secondary | ICD-10-CM

## 2012-08-11 NOTE — H&P (Signed)
Urology History and Physical Exam  CC: Urethral stricture  HPI: 47 year old male presents for urethral dilation and cystolihtalopaxy. He has a rapidly recurrent stricture and recent findings of  A urethral stone that was negotiated into the bladder with recent emergent catheter placement within the bladder in the ER earlier this week.  PMH: Past Medical History  Diagnosis Date  . History of pulmonary embolus (PE) POST SURG. 2008  . Chronic urethral stricture HX DILATION   . Frequency of urination     DUE TO URETHRAL STRICTURE  . Urgency of urination   . Dysuria   . Headache, classical migraine   . Seasonal allergies     PSH: Past Surgical History  Procedure Laterality Date  . Cysto/ retrograde urethrogram/ balloon dilation of urethral stricture  X5 LAST ONE 12-05-2011    FEB 2011/  2009 /  2008  . Cervical fusion  2004    C4 - 7  . Repair urethral stenosis  AS CHILD  . Inguinal hernia repair  FEB 2011    LEFT  . Cystoscopy with urethral dilatation  05/16/2011    Procedure: CYSTOSCOPY WITH URETHRAL DILATATION;  Surgeon: Martina Sinner, MD;  Location: [facility];  Service: Urology;  Laterality: N/A;  BALLOON DILATION AND RETROGRADE   . Cystoscopy/retrograde/ureteroscopy/stone extraction with basket  06/20/2011    Procedure: CYSTOSCOPY/RETROGRADE/URETEROSCOPY/STONE EXTRACTION WITH BASKET;  Surgeon: Sebastian Ache, MD;  Location: [facility];  Service: Urology;  Laterality: Left;  . Balloon dilation  06/20/2011    Procedure: BALLOON DILATION;  Surgeon: Sebastian Ache, MD;  Location: [facility];  Service: Urology;  Laterality: Left;  . Cystoscopy with urethral dilatation  12/30/2011    Procedure: CYSTOSCOPY WITH URETHRAL DILATATION;  Surgeon: Martina Sinner, MD;  Location: [facility];  Service: Urology;  Laterality: N/A;  CYSTO, BALLOON DILATION, RETROGRADE URETHROGRAM  . Cystoscopy with urethral dilatation N/A 05/06/2012    Procedure: CYSTOSCOPY WITH BALLOON DILATATION;   Surgeon: Martina Sinner, MD;  Location: [facility];  Service: Urology;  Laterality: N/A;  . Hypospadius repair  child    Allergies: No Known Allergies  Medications: No prescriptions prior to admission     Social History: History   Social History  . Marital Status: Married    Spouse Name: N/A    Number of Children: N/A  . Years of Education: N/A   Occupational History  . Not on file.   Social History Main Topics  . Smoking status: Never Smoker   . Smokeless tobacco: Never Used  . Alcohol Use: Yes     Comment: OCCASIONAL  . Drug Use: No  . Sexually Active: Not on file   Other Topics Concern  . Not on file   Social History Narrative  . No narrative on file    Family History: History reviewed. No pertinent family history.  Review of Systems: Positive: Multiple LUTS, flank pain Negative:  A further 10 point review of systems was negative except what is listed in the HPI.  Physical Exam: @VITALS2 @ General: No acute distress.  Awake. Head:  Normocephalic.  Atraumatic. ENT:  EOMI.  Mucous membranes moist Neck:  Supple.  No lymphadenopathy. CV:  S1 present. S2 present. Regular rate. Pulmonary: Equal effort bilaterally.  Clear to auscultation bilaterally. Abdomen: Soft.  Nontender to palpation. Skin:  Normal turgor.  No visible rash. Extremity: No gross deformity of bilateral upper extremities.  No gross deformity of    bilateral lower extremities. Neurologic: Alert. Appropriate mood.  Penis:  circumcised.  No lesions. Urethra: Foley catheter in place.  Hypospadic meatus. Scrotum: No lesions.  No ecchymosis.  No erythema. Testicles: Descended bilaterally.  No masses bilaterally. Epididymis: Palpable bilaterally.  Non Tender to palpation.  Studies:  Recent Labs     08/09/12  1947  HGB  14.4  WBC  12.9*  PLT  318    Recent Labs     08/09/12  1947  NA  132*  K  3.8  CL  96  CO2  21  BUN  21  CREATININE  1.05  CALCIUM  10.2  GFRNONAA  82*  GFRAA   >90     No results found for this basename: PT, INR, APTT,  in the last 72 hours   No components found with this basename: ABG,     Assessment:  Urethral stricture and bladder calculus  Plan: Anesthetic cystoscopy, balloon dilation of urethral stricture, cystolithalopaxy

## 2012-08-12 ENCOUNTER — Encounter (HOSPITAL_BASED_OUTPATIENT_CLINIC_OR_DEPARTMENT_OTHER): Payer: Self-pay | Admitting: *Deleted

## 2012-08-12 ENCOUNTER — Encounter (HOSPITAL_BASED_OUTPATIENT_CLINIC_OR_DEPARTMENT_OTHER)
Admission: RE | Disposition: A | Discharge status: Home / Self Care | Payer: Self-pay | Source: Ambulatory Visit | Attending: Urology

## 2012-08-12 ENCOUNTER — Ambulatory Visit (HOSPITAL_BASED_OUTPATIENT_CLINIC_OR_DEPARTMENT_OTHER)
Admission: RE | Admit: 2012-08-12 | Discharge: 2012-08-12 | Disposition: A | Discharge status: Home / Self Care | Payer: BC Managed Care – PPO | Source: Ambulatory Visit | Attending: Urology | Admitting: Urology

## 2012-08-12 ENCOUNTER — Ambulatory Visit (HOSPITAL_COMMUNITY): Payer: BC Managed Care – PPO

## 2012-08-12 ENCOUNTER — Encounter (HOSPITAL_BASED_OUTPATIENT_CLINIC_OR_DEPARTMENT_OTHER): Payer: Self-pay | Admitting: Anesthesiology

## 2012-08-12 ENCOUNTER — Ambulatory Visit (HOSPITAL_BASED_OUTPATIENT_CLINIC_OR_DEPARTMENT_OTHER): Payer: BC Managed Care – PPO | Admitting: Anesthesiology

## 2012-08-12 DIAGNOSIS — N210 Calculus in bladder: Secondary | ICD-10-CM | POA: Insufficient documentation

## 2012-08-12 DIAGNOSIS — Z86711 Personal history of pulmonary embolism: Secondary | ICD-10-CM | POA: Insufficient documentation

## 2012-08-12 DIAGNOSIS — N35919 Unspecified urethral stricture, male, unspecified site: Secondary | ICD-10-CM | POA: Insufficient documentation

## 2012-08-12 DIAGNOSIS — R338 Other retention of urine: Secondary | ICD-10-CM | POA: Insufficient documentation

## 2012-08-12 DIAGNOSIS — IMO0002 Reserved for concepts with insufficient information to code with codable children: Secondary | ICD-10-CM

## 2012-08-12 HISTORY — PX: CYSTOSCOPY WITH URETHRAL DILATATION: SHX5125

## 2012-08-12 HISTORY — PX: CYSTOSCOPY WITH LITHOLAPAXY: SHX1425

## 2012-08-12 LAB — POCT I-STAT 4, (NA,K, GLUC, HGB,HCT): Sodium: 139 mEq/L (ref 135–145)

## 2012-08-12 SURGERY — CYSTOSCOPY, WITH URETHRAL DILATION
Anesthesia: General | Site: Bladder | Wound class: Clean Contaminated

## 2012-08-12 MED ORDER — FENTANYL CITRATE 0.05 MG/ML IJ SOLN
25.0000 ug | INTRAMUSCULAR | Status: DC | PRN
  Filled 2012-08-12: qty 1

## 2012-08-12 MED ORDER — DEXAMETHASONE SODIUM PHOSPHATE 4 MG/ML IJ SOLN
INTRAMUSCULAR | Status: DC | PRN
  Administered 2012-08-12: 10 mg via INTRAVENOUS

## 2012-08-12 MED ORDER — MIDAZOLAM HCL 5 MG/5ML IJ SOLN
INTRAMUSCULAR | Status: DC | PRN
  Administered 2012-08-12: 2 mg via INTRAVENOUS

## 2012-08-12 MED ORDER — ONDANSETRON HCL 4 MG/2ML IJ SOLN
INTRAMUSCULAR | Status: DC | PRN
  Administered 2012-08-12: 4 mg via INTRAVENOUS

## 2012-08-12 MED ORDER — HYDROCODONE-ACETAMINOPHEN 5-325 MG PO TABS: 1.0000 | ORAL_TABLET | ORAL | Status: DC | PRN

## 2012-08-12 MED ORDER — LIDOCAINE HCL 2 % EX GEL: Freq: Every day | CUTANEOUS | Status: DC

## 2012-08-12 MED ORDER — LACTATED RINGERS IV SOLN
INTRAVENOUS | Status: DC
  Administered 2012-08-12: 08:00:00 via INTRAVENOUS
  Filled 2012-08-12: qty 1000

## 2012-08-12 MED ORDER — HYDROCODONE-ACETAMINOPHEN 5-325 MG PO TABS
1.0000 | ORAL_TABLET | Freq: Four times a day (QID) | ORAL | Status: DC | PRN
  Administered 2012-08-12: 1 via ORAL
  Filled 2012-08-12: qty 2

## 2012-08-12 MED ORDER — FENTANYL CITRATE 0.05 MG/ML IJ SOLN
INTRAMUSCULAR | Status: DC | PRN
  Administered 2012-08-12 (×2): 50 ug via INTRAVENOUS

## 2012-08-12 MED ORDER — LACTATED RINGERS IV SOLN
INTRAVENOUS | Status: DC
  Filled 2012-08-12: qty 1000

## 2012-08-12 MED ORDER — LIDOCAINE HCL (CARDIAC) 20 MG/ML IV SOLN
INTRAVENOUS | Status: DC | PRN
  Administered 2012-08-12: 80 mg via INTRAVENOUS

## 2012-08-12 MED ORDER — CEFAZOLIN SODIUM-DEXTROSE 2-3 GM-% IV SOLR
2.0000 g | INTRAVENOUS | Status: AC
  Administered 2012-08-12: 2 g via INTRAVENOUS
  Filled 2012-08-12: qty 50

## 2012-08-12 MED ORDER — PROMETHAZINE HCL 25 MG/ML IJ SOLN
6.2500 mg | INTRAMUSCULAR | Status: DC | PRN
  Filled 2012-08-12: qty 1

## 2012-08-12 MED ORDER — IOHEXOL 350 MG/ML SOLN
INTRAVENOUS | Status: DC | PRN
  Administered 2012-08-12: 20 mL

## 2012-08-12 MED ORDER — SODIUM CHLORIDE 0.9 % IR SOLN
Status: DC | PRN
  Administered 2012-08-12: 3000 mL

## 2012-08-12 MED ORDER — PROPOFOL 10 MG/ML IV BOLUS
INTRAVENOUS | Status: DC | PRN
  Administered 2012-08-12: 200 mg via INTRAVENOUS

## 2012-08-12 SURGICAL SUPPLY — 44 items
ADAPTER CATH URET PLST 4-6FR (CATHETERS) IMPLANT
BAG DRAIN URO-CYSTO SKYTR STRL (DRAIN) ×3 IMPLANT
BAG URINE LEG 19OZ MD ST LTX (BAG) IMPLANT
BALLN NEPHROSTOMY (BALLOONS) ×3
BALLOON NEPHROSTOMY (BALLOONS) ×2 IMPLANT
BASKET LASER NITINOL 1.9FR (BASKET) IMPLANT
BASKET STNLS GEMINI 4WIRE 3FR (BASKET) IMPLANT
BASKET ZERO TIP NITINOL 2.4FR (BASKET) IMPLANT
CANISTER SUCT LVC 12 LTR MEDI- (MISCELLANEOUS) ×3 IMPLANT
CATH FOLEY 2WAY SLVR  5CC 20FR (CATHETERS)
CATH FOLEY 2WAY SLVR  5CC 22FR (CATHETERS)
CATH FOLEY 2WAY SLVR 5CC 20FR (CATHETERS) IMPLANT
CATH FOLEY 2WAY SLVR 5CC 22FR (CATHETERS) IMPLANT
CATH INTERMIT  6FR 70CM (CATHETERS) IMPLANT
CATH URET 5FR 28IN CONE TIP (BALLOONS)
CATH URET 5FR 28IN OPEN ENDED (CATHETERS) IMPLANT
CATH URET 5FR 70CM CONE TIP (BALLOONS) IMPLANT
CLOTH BEACON ORANGE TIMEOUT ST (SAFETY) ×3 IMPLANT
DRAPE CAMERA CLOSED 9X96 (DRAPES) ×3 IMPLANT
ELECTROHYDROLIC PROBE 9FR (MISCELLANEOUS) IMPLANT
GLOVE BIO SURGEON STRL SZ8 (GLOVE) ×3 IMPLANT
GLOVE BIOGEL PI IND STRL 6.5 (GLOVE) ×2 IMPLANT
GLOVE BIOGEL PI INDICATOR 6.5 (GLOVE) ×1
GLOVE ECLIPSE 6.5 STRL STRAW (GLOVE) ×3 IMPLANT
GOWN STRL NON-REIN LRG LVL3 (GOWN DISPOSABLE) ×3 IMPLANT
GOWN STRL REIN XL XLG (GOWN DISPOSABLE) ×3 IMPLANT
GOWN XL W/COTTON TOWEL STD (GOWNS) IMPLANT
GUIDEWIRE 0.038 PTFE COATED (WIRE) IMPLANT
GUIDEWIRE ANG ZIPWIRE 038X150 (WIRE) IMPLANT
GUIDEWIRE STR DUAL SENSOR (WIRE) IMPLANT
KIT BALLIN UROMAX 15FX10 (LABEL) IMPLANT
KIT BALLN UROMAX 15FX4 (MISCELLANEOUS) IMPLANT
KIT BALLN UROMAX 26 75X4 (MISCELLANEOUS)
LASER FIBER DISP (UROLOGICAL SUPPLIES) IMPLANT
LASER FIBER DISP 1000U (UROLOGICAL SUPPLIES) IMPLANT
NS IRRIG 500ML POUR BTL (IV SOLUTION) IMPLANT
PACK CYSTOSCOPY (CUSTOM PROCEDURE TRAY) ×3 IMPLANT
PROBE LITHO 3.3FR 2137.235 (UROLOGICAL SUPPLIES) IMPLANT
PROBE LITHO 5.0FR 2137.1505 (MISCELLANEOUS) IMPLANT
SET HIGH PRES BAL DIL (LABEL)
SHEATH URET ACCESS 12FR/35CM (UROLOGICAL SUPPLIES) IMPLANT
SHEATH URET ACCESS 12FR/55CM (UROLOGICAL SUPPLIES) IMPLANT
WATER STERILE IRR 3000ML UROMA (IV SOLUTION) ×3 IMPLANT
WATER STERILE IRR 500ML POUR (IV SOLUTION) IMPLANT

## 2012-08-12 NOTE — Transfer of Care (Signed)
Immediate Anesthesia Transfer of Care Note  Patient: [name]  Procedure(s) Performed: Procedure(s): CYSTOSCOPY WITH URETHRAL DILATATION  CYSTOSCOPY WITH BALLOON DILATION OF URETHRAL STRICTURE (N/A) CYSTOSCOPY WITH LITHOLAPAXY (N/A)  Patient Location: PACU  Anesthesia Type:General  Level of Consciousness: awake, alert  and oriented  Airway & Oxygen Therapy: Patient Spontanous Breathing and Patient connected to nasal cannula oxygen  Post-op Assessment: Report given to PACU RN  Post vital signs: Reviewed and stable  Complications: No apparent anesthesia complications

## 2012-08-12 NOTE — Op Note (Signed)
Preoperative diagnosis: Recurrent urethral stricture with recent retention, bladder calculus  Postoperative diagnosis: Same   Procedure: Cystoscopy, balloon dilation of distal urethral stricture, extraction of bladder stone    Surgeon: Bertram Millard. Teyonna Plaisted, M.D.   Anesthesia: Gen.   Complications: None  Specimen(s): Bladder stone, the patient's wife  Drain(s): None  Indications: 47 year-old male with a history of urolithiasis, with passage of her recent stone into his bladder and urethra, as well as a dense recurrent urethral stricture. He has had for urethral dilations of the past year. He is on self-catheterization. I saw him 3 days ago in the emergency room with urinary retention and a significant distal urethral stricture. After long discussion, I eventually placed a 12 French Foley catheter, and he presents at this time for urethral dilation, possible cystolitholapaxy. There is remaining left lower pole renal calculus, 4 mm in size it is asymptomatic and will be left alone.    Technique and findings: The patient was properly identified in the holding area. He was taken to the operating room following administration of IV Ancef. He was given a general anesthetic with the LMA, and placed in the dorsolithotomy position. Genitalia and perineum were prepped and draped following catheter removal. Over time out was performed.  I then passed a sensor-tip guidewire using fluoroscopic guidance into the patient's bladder. Over top of this a 15 mm nephrostomy tract balloon was placed, and inflated to 16     atmospheres of pressure for just under 5 minutes. The balloon was deflated, and I advanced a 22 [place] panendoscope. The dense urethral stricture had been opened up in the distal urethra. Proximal to this the urethra was minimally dilated. Prostate was nonobstructive. There were no urethral lesions other than the previously mentioned stricture. The bladder was entered and inspected  circumferentially. There were no tumors or trabeculations. There was a right sided diverticulum at the dome of the bladder. There was minimal erythema in and trigonal area secondary to catheter placement. There was a 3-4 mm stone that was negotiated out of the bladder. Following adequate inspection of the bladder and stone removal, I then removed the scope. A minimal amount of water was left in for later voiding trial. The patient was then awakened and taken to PACU in stable condition.  He will return to my office in approximately one week. At that time, using lidocaine jelly in the urethra, we will teach him self intermittent catheterization which will need to be done daily for the first month or 2 I would assume a 14 French coud tip catheter would be the best catheter start with for self dilation.

## 2012-08-12 NOTE — Anesthesia Procedure Notes (Signed)
Procedure Name: LMA Insertion Date/Time: 08/12/2012 9:08 AM Performed by: Maris Berger T Pre-anesthesia Checklist: Patient identified, Emergency Drugs available, Suction available and Patient being monitored Patient Re-evaluated:Patient Re-evaluated prior to inductionOxygen Delivery Method: Circle System Utilized Preoxygenation: Pre-oxygenation with 100% oxygen Intubation Type: IV induction Ventilation: Mask ventilation without difficulty LMA: LMA inserted LMA Size: 5.0 Number of attempts: 1 Airway Equipment and Method: bite block Placement Confirmation: positive ETCO2 Dental Injury: Teeth and Oropharynx as per pre-operative assessment

## 2012-08-12 NOTE — Anesthesia Preprocedure Evaluation (Addendum)
Anesthesia Evaluation  Patient identified by MRN, date of birth, ID band Patient awake    Reviewed: Allergy & Precautions, H&P , NPO status , Patient's Chart, lab work & pertinent test results, reviewed documented beta blocker date and time   Airway Mallampati: II TM Distance: >3 FB Neck ROM: full    Dental no notable dental hx. (+) Dental Advisory Given   Pulmonary PE breath sounds clear to auscultation  Pulmonary exam normal       Cardiovascular Exercise Tolerance: Good negative cardio ROS  Rhythm:regular Rate:Normal     Neuro/Psych  Headaches, negative neurological ROS  negative psych ROS   GI/Hepatic negative GI ROS, Neg liver ROS,   Endo/Other  negative endocrine ROS  Renal/GU Renal disease     Musculoskeletal   Abdominal   Peds  Hematology negative hematology ROS (+)   Anesthesia Other Findings   Reproductive/Obstetrics negative OB ROS                           Anesthesia Physical Anesthesia Plan  ASA: II  Anesthesia Plan: General   Post-op Pain Management:    Induction: Intravenous  Airway Management Planned: LMA  Additional Equipment:   Intra-op Plan:   Post-operative Plan: Extubation in OR  Informed Consent: I have reviewed the patients History and Physical, chart, labs and discussed the procedure including the risks, benefits and alternatives for the proposed anesthesia with the patient or authorized representative who has indicated his/her understanding and acceptance.   Dental advisory given  Plan Discussed with: CRNA  Anesthesia Plan Comments:         Anesthesia Quick Evaluation

## 2012-08-14 NOTE — Anesthesia Postprocedure Evaluation (Signed)
Anesthesia Post Note  Patient: [name]  Procedure(s) Performed: Procedure(s) (LRB): CYSTOSCOPY WITH URETHRAL DILATATION  CYSTOSCOPY WITH BALLOON DILATION OF URETHRAL STRICTURE (N/A) CYSTOSCOPY WITH LITHOLAPAXY (N/A)  Anesthesia type: General  Patient location: PACU  Post pain: Pain level controlled  Post assessment: Post-op Vital signs reviewed  Last Vitals:  Filed Vitals:   08/12/12 1056  BP: 123/82  Pulse: 83  Temp: 36.1 C  Resp: 16    Post vital signs: Reviewed  Level of consciousness: sedated  Complications: No apparent anesthesia complications

## 2012-08-17 ENCOUNTER — Encounter (HOSPITAL_BASED_OUTPATIENT_CLINIC_OR_DEPARTMENT_OTHER): Payer: Self-pay | Admitting: Urology

## 2012-10-12 ENCOUNTER — Encounter (HOSPITAL_COMMUNITY): Payer: Self-pay | Admitting: Anesthesiology

## 2012-10-12 ENCOUNTER — Ambulatory Visit (HOSPITAL_COMMUNITY)
Admission: AD | Admit: 2012-10-12 | Discharge: 2012-10-12 | Disposition: A | Discharge status: Home / Self Care | Payer: BC Managed Care – PPO | Source: Ambulatory Visit | Attending: Urology | Admitting: Urology

## 2012-10-12 ENCOUNTER — Encounter (HOSPITAL_COMMUNITY)
Admission: AD | Disposition: A | Discharge status: Home / Self Care | Payer: Self-pay | Source: Ambulatory Visit | Attending: Urology

## 2012-10-12 ENCOUNTER — Other Ambulatory Visit: Payer: Self-pay | Admitting: Urology

## 2012-10-12 ENCOUNTER — Encounter (HOSPITAL_COMMUNITY): Payer: Self-pay | Admitting: *Deleted

## 2012-10-12 ENCOUNTER — Ambulatory Visit (HOSPITAL_COMMUNITY): Payer: BC Managed Care – PPO | Admitting: Anesthesiology

## 2012-10-12 DIAGNOSIS — N35919 Unspecified urethral stricture, male, unspecified site: Secondary | ICD-10-CM | POA: Insufficient documentation

## 2012-10-12 DIAGNOSIS — N323 Diverticulum of bladder: Secondary | ICD-10-CM | POA: Insufficient documentation

## 2012-10-12 DIAGNOSIS — Z86711 Personal history of pulmonary embolism: Secondary | ICD-10-CM | POA: Insufficient documentation

## 2012-10-12 DIAGNOSIS — Z87442 Personal history of urinary calculi: Secondary | ICD-10-CM | POA: Insufficient documentation

## 2012-10-12 HISTORY — PX: CYSTOSCOPY WITH URETHRAL DILATATION: SHX5125

## 2012-10-12 LAB — CBC
HCT: 37.6 % — ABNORMAL LOW (ref 39.0–52.0)
MCHC: 36.2 g/dL — ABNORMAL HIGH (ref 30.0–36.0)
MCV: 84.5 fL (ref 78.0–100.0)
RDW: 13 % (ref 11.5–15.5)

## 2012-10-12 SURGERY — CYSTOSCOPY, WITH URETHRAL DILATION
Anesthesia: General | Site: Pelvis | Wound class: Clean Contaminated

## 2012-10-12 MED ORDER — SULFAMETHOXAZOLE-TMP DS 800-160 MG PO TABS: 1.0000 | ORAL_TABLET | Freq: Two times a day (BID) | ORAL | Status: DC

## 2012-10-12 MED ORDER — GENTAMICIN IN SALINE 1.6-0.9 MG/ML-% IV SOLN: 80.0000 mg | INTRAVENOUS | Status: DC

## 2012-10-12 MED ORDER — PROMETHAZINE HCL 25 MG/ML IJ SOLN: 6.2500 mg | INTRAMUSCULAR | Status: DC | PRN

## 2012-10-12 MED ORDER — LACTATED RINGERS IV SOLN
INTRAVENOUS | Status: DC
  Administered 2012-10-12 (×2): via INTRAVENOUS

## 2012-10-12 MED ORDER — SENNOSIDES-DOCUSATE SODIUM 8.6-50 MG PO TABS: 1.0000 | ORAL_TABLET | Freq: Two times a day (BID) | ORAL | Status: DC

## 2012-10-12 MED ORDER — SODIUM CHLORIDE 0.9 % IR SOLN
Status: DC | PRN
  Administered 2012-10-12: 1000 mL via INTRAVESICAL

## 2012-10-12 MED ORDER — MIDAZOLAM HCL 5 MG/5ML IJ SOLN
INTRAMUSCULAR | Status: DC | PRN
  Administered 2012-10-12: 2 mg via INTRAVENOUS

## 2012-10-12 MED ORDER — FENTANYL CITRATE 0.05 MG/ML IJ SOLN
25.0000 ug | INTRAMUSCULAR | Status: DC | PRN
  Administered 2012-10-12: 50 ug via INTRAVENOUS

## 2012-10-12 MED ORDER — FENTANYL CITRATE 0.05 MG/ML IJ SOLN
INTRAMUSCULAR | Status: AC
  Filled 2012-10-12: qty 2

## 2012-10-12 MED ORDER — ONDANSETRON HCL 4 MG/2ML IJ SOLN
INTRAMUSCULAR | Status: DC | PRN
  Administered 2012-10-12: 4 mg via INTRAMUSCULAR

## 2012-10-12 MED ORDER — OXYCODONE-ACETAMINOPHEN 5-325 MG PO TABS
1.0000 | ORAL_TABLET | Freq: Once | ORAL | Status: AC
  Administered 2012-10-12: 1 via ORAL

## 2012-10-12 MED ORDER — FENTANYL CITRATE 0.05 MG/ML IJ SOLN
INTRAMUSCULAR | Status: DC | PRN
  Administered 2012-10-12 (×2): 50 ug via INTRAVENOUS
  Administered 2012-10-12: 100 ug via INTRAVENOUS

## 2012-10-12 MED ORDER — GENTAMICIN SULFATE 40 MG/ML IJ SOLN
5.0000 mg/kg | INTRAVENOUS | Status: AC
  Administered 2012-10-12: 410 mg via INTRAVENOUS
  Filled 2012-10-12: qty 10.25

## 2012-10-12 MED ORDER — OXYCODONE-ACETAMINOPHEN 5-325 MG PO TABS
ORAL_TABLET | ORAL | Status: AC
  Filled 2012-10-12: qty 1

## 2012-10-12 MED ORDER — EPHEDRINE SULFATE 50 MG/ML IJ SOLN
INTRAMUSCULAR | Status: DC | PRN
  Administered 2012-10-12: 5 mg via INTRAVENOUS

## 2012-10-12 MED ORDER — PROPOFOL 10 MG/ML IV BOLUS
INTRAVENOUS | Status: DC | PRN
  Administered 2012-10-12: 190 mg via INTRAVENOUS

## 2012-10-12 MED ORDER — LIDOCAINE HCL 1 % IJ SOLN
INTRAMUSCULAR | Status: DC | PRN
  Administered 2012-10-12: 60 mg via INTRADERMAL

## 2012-10-12 MED ORDER — DIATRIZOATE MEGLUMINE 30 % UR SOLN
URETHRAL | Status: DC | PRN
  Administered 2012-10-12: 30 mL via URETHRAL

## 2012-10-12 MED ORDER — OXYCODONE-ACETAMINOPHEN 5-325 MG PO TABS: 1.0000 | ORAL_TABLET | ORAL | Status: DC | PRN

## 2012-10-12 SURGICAL SUPPLY — 21 items
BAG URINE DRAINAGE (UROLOGICAL SUPPLIES) ×2 IMPLANT
BAG URO CATCHER STRL LF (DRAPE) ×2 IMPLANT
BALLN NEPHROSTOMY (BALLOONS) ×2
BALLOON NEPHROSTOMY (BALLOONS) ×1 IMPLANT
BASKET LASER NITINOL 1.9FR (BASKET) IMPLANT
CATH FOLEY 2W COUNCIL 20FR 5CC (CATHETERS) ×2 IMPLANT
CATH FOLEY 2WAY  3CC 10FR (CATHETERS) ×1
CATH FOLEY 2WAY 3CC 10FR (CATHETERS) ×1 IMPLANT
CATH INTERMIT  6FR 70CM (CATHETERS) IMPLANT
CATH URET 5FR 28IN OPEN ENDED (CATHETERS) ×2 IMPLANT
CLOTH BEACON ORANGE TIMEOUT ST (SAFETY) ×2 IMPLANT
DRAPE CAMERA CLOSED 9X96 (DRAPES) ×2 IMPLANT
GLOVE BIOGEL M STRL SZ7.5 (GLOVE) ×2 IMPLANT
GOWN PREVENTION PLUS LG XLONG (DISPOSABLE) IMPLANT
GOWN PREVENTION PLUS XLARGE (GOWN DISPOSABLE) ×2 IMPLANT
GUIDEWIRE ANG ZIPWIRE 038X150 (WIRE) IMPLANT
GUIDEWIRE STR DUAL SENSOR (WIRE) ×4 IMPLANT
MANIFOLD NEPTUNE II (INSTRUMENTS) ×2 IMPLANT
PACK CYSTO (CUSTOM PROCEDURE TRAY) ×2 IMPLANT
TUBE FEEDING 8FR 16IN STR KANG (MISCELLANEOUS) IMPLANT
TUBING CONNECTING 10 (TUBING) ×2 IMPLANT

## 2012-10-12 NOTE — Anesthesia Preprocedure Evaluation (Signed)
Anesthesia Evaluation  Patient identified by MRN, date of birth, ID band Patient awake    Reviewed: Allergy & Precautions, H&P , NPO status , Patient's Chart, lab work & pertinent test results  Airway Mallampati: II TM Distance: >3 FB Neck ROM: Full    Dental no notable dental hx.    Pulmonary PE H/O PE breath sounds clear to auscultation  Pulmonary exam normal       Cardiovascular Exercise Tolerance: Good negative cardio ROS  Rhythm:Regular Rate:Normal     Neuro/Psych  Headaches, negative psych ROS   GI/Hepatic negative GI ROS, Neg liver ROS,   Endo/Other  negative endocrine ROS  Renal/GU negative Renal ROS  negative genitourinary   Musculoskeletal negative musculoskeletal ROS (+)   Abdominal   Peds negative pediatric ROS (+)  Hematology negative hematology ROS (+)   Anesthesia Other Findings   Reproductive/Obstetrics negative OB ROS                           Anesthesia Physical Anesthesia Plan  ASA: II  Anesthesia Plan: General   Post-op Pain Management:    Induction: Intravenous  Airway Management Planned: LMA  Additional Equipment:   Intra-op Plan:   Post-operative Plan: Extubation in OR  Informed Consent: I have reviewed the patients History and Physical, chart, labs and discussed the procedure including the risks, benefits and alternatives for the proposed anesthesia with the patient or authorized representative who has indicated his/her understanding and acceptance.   Dental advisory given  Plan Discussed with: CRNA  Anesthesia Plan Comments:         Anesthesia Quick Evaluation

## 2012-10-12 NOTE — OR Nursing (Signed)
Foley catheter placed by dr. Berneice Heinrich 06/2011 was not in patient before surgery today.

## 2012-10-12 NOTE — Anesthesia Postprocedure Evaluation (Signed)
  Anesthesia Post-op Note  Patient: [name]  Procedure(s) Performed: Procedure(s) (LRB): CYSTOSCOPY WITH URETHRAL DILATATION (N/A)  Patient Location: PACU  Anesthesia Type: General  Level of Consciousness: awake and alert   Airway and Oxygen Therapy: Patient Spontanous Breathing  Post-op Pain: mild  Post-op Assessment: Post-op Vital signs reviewed, Patient's Cardiovascular Status Stable, Respiratory Function Stable, Patent Airway and No signs of Nausea or vomiting  Last Vitals:  Filed Vitals:   10/12/12 2000  BP: 132/97  Pulse:   Temp: 36.4 C  Resp:     Post-op Vital Signs: stable   Complications: No apparent anesthesia complications

## 2012-10-12 NOTE — H&P (Signed)
[name] is an 47 y.o. male.    Chief Complaint: Recurrent Urethral Stricture  HPI:   1 - Hypospadias - s/p primary repair at age 24 of mid-shaft hypospadias  2 - Recurrent Urethral Stricture - s/p at least 8 balloon dilations in past few years of mid urethral stricture with recurrent retention. Was oginiannly going to see Terlecki at [facility], but did not keep appt.   3 - Recurrent Nephrolithiasis - s/p URS x several for recurrent stones. Most Recent CT 08/2012 with left intrarenal stone only.  PMH sig for hernia repair and nec surgery (no deficits). No CV disesae. No blood thinners.  Today [name] is seen as urgent f/u for above. He reports he is just dribbling urine again, unable to self-cath, and feels [name] has impending urinary retention as he has had many times before. Last meal last PM.   Past Medical History  Diagnosis Date  . History of pulmonary embolus (PE) POST SURG. 2008  . Chronic urethral stricture HX DILATION   . Frequency of urination     DUE TO URETHRAL STRICTURE  . Urgency of urination   . Dysuria   . Headache, classical migraine   . Seasonal allergies     Past Surgical History  Procedure Laterality Date  . Cysto/ retrograde urethrogram/ balloon dilation of urethral stricture  X5 LAST ONE 12-05-2011    FEB 2011/  2009 /  2008  . Cervical fusion  2004    C4 - 7  . Repair urethral stenosis  AS CHILD  . Inguinal hernia repair  FEB 2011    LEFT  . Cystoscopy with urethral dilatation  05/16/2011    Procedure: CYSTOSCOPY WITH URETHRAL DILATATION;  Surgeon: Martina Sinner, MD;  Location: [facility];  Service: Urology;  Laterality: N/A;  BALLOON DILATION AND RETROGRADE   . Cystoscopy/retrograde/ureteroscopy/stone extraction with basket  06/20/2011    Procedure: CYSTOSCOPY/RETROGRADE/URETEROSCOPY/STONE EXTRACTION WITH BASKET;  Surgeon: Sebastian Ache, MD;  Location: [facility];  Service: Urology;  Laterality: Left;  . Balloon dilation  06/20/2011   Procedure: BALLOON DILATION;  Surgeon: Sebastian Ache, MD;  Location: [facility];  Service: Urology;  Laterality: Left;  . Cystoscopy with urethral dilatation  12/30/2011    Procedure: CYSTOSCOPY WITH URETHRAL DILATATION;  Surgeon: Martina Sinner, MD;  Location: [facility];  Service: Urology;  Laterality: N/A;  CYSTO, BALLOON DILATION, RETROGRADE URETHROGRAM  . Cystoscopy with urethral dilatation N/A 05/06/2012    Procedure: CYSTOSCOPY WITH BALLOON DILATATION;  Surgeon: Martina Sinner, MD;  Location: [facility];  Service: Urology;  Laterality: N/A;  . Hypospadius repair  child  . Cystoscopy with urethral dilatation N/A 08/12/2012    Procedure: CYSTOSCOPY WITH URETHRAL DILATATION  CYSTOSCOPY WITH BALLOON DILATION OF URETHRAL STRICTURE;  Surgeon: Marcine Matar, MD;  Location: [facility];  Service: Urology;  Laterality: N/A;  . Cystoscopy with litholapaxy N/A 08/12/2012    Procedure: CYSTOSCOPY WITH LITHOLAPAXY;  Surgeon: Marcine Matar, MD;  Location: [facility];  Service: Urology;  Laterality: N/A;    No family history on file. Social History:  reports that he has never smoked. He has never used smokeless tobacco. He reports that  drinks alcohol. He reports that he does not use illicit drugs.  Allergies: No Known Allergies  Medications Prior to Admission  Medication Sig Dispense Refill  . ibuprofen (ADVIL,MOTRIN) 200 MG tablet Take 200 mg by mouth every 6 (six) hours as needed for pain.      Marland Kitchen  rizatriptan (MAXALT) 10 MG tablet Take 10 mg by mouth as needed. May repeat in 2 hours if needed for migraines      . SUMAtriptan (IMITREX) 50 MG tablet Take 50 mg by mouth every 2 (two) hours as needed for migraine.       . triamcinolone (NASACORT) 55 MCG/ACT nasal inhaler Place 2 sprays into the nose daily as needed.         No results found for this or any previous visit (from the past 48 hour(s)). No results found.  Review of Systems  Constitutional: Negative.   Negative for fever and chills.  HENT: Negative.   Eyes: Negative.   Respiratory: Negative.   Cardiovascular: Negative.   Gastrointestinal: Negative.   Genitourinary: Positive for urgency and frequency.  Musculoskeletal: Negative.   Skin: Negative.   Neurological: Negative.   Endo/Heme/Allergies: Negative.   Psychiatric/Behavioral: Negative.     Blood pressure 130/98, pulse 84, temperature 98.5 F (36.9 C), temperature source Oral, resp. rate 16, height 5\' 11"  (1.803 m), weight 80.967 kg (178 lb 8 oz), SpO2 99.00%. Physical Exam  Constitutional: He is oriented to person, place, and time. He appears well-developed and well-nourished.  HENT:  Head: Normocephalic and atraumatic.  Eyes: EOM are normal. Pupils are equal, round, and reactive to light.  Neck: Normal range of motion. Neck supple.  Cardiovascular: Normal rate.   Respiratory: Effort normal and breath sounds normal.  GI: Soft. Bowel sounds are normal.  Genitourinary: Penis normal.  Stigmata of treated hypospadias. Palpable scar tissue midshaft over urethra.  Musculoskeletal: Normal range of motion.  Neurological: He is alert and oriented to person, place, and time.  Skin: Skin is warm and dry.  Psychiatric: He has a normal mood and affect. His behavior is normal. Judgment and thought content normal.     Assessment/Plan  1 - Hypospadias - likely recurrent stricture at prior proximal site.   2 - Recurrent Urethral Stricture - Discussed short term and long term goals. Short term, needs repeat dilation for impending retention. Discussed risks / benefits of cysto / RUG / balloon dilation which he has had many times before and wants to proceed today.   I specifically told him this was a "band aid" and that he needs formal urethroplasty ASAP. He voiced understanding and I will make referral. I outlined urethral recontrustion including end to end as well as buccal and overall course often wtih staged approach.   3 - Recurrent  Nephrolithiasis - small non-osbtructing stone most recently. Re-iscussed importance of metabolic eval going forward.  4 - Refer Terlecki at [facility].  Amyia Lodwick 10/12/2012, 3:56 PM

## 2012-10-12 NOTE — Brief Op Note (Signed)
10/12/2012  6:21 PM  PATIENT:  [name]  48 y.o. male  PRE-OPERATIVE DIAGNOSIS:  recurrent urethral stricture  POST-OPERATIVE DIAGNOSIS:  recurrent urethral stricture  PROCEDURE:  Procedure(s) with comments: CYSTOSCOPY WITH URETHRAL DILATATION (N/A) - cysto, retrograde, urethrogram, balloon dilation of urethreal stricture, bilateral retrograde pyelograms  SURGEON:  Surgeon(s) and Role:    * Sebastian Ache, MD - Primary  PHYSICIAN ASSISTANT:   ASSISTANTS: none   ANESTHESIA:   general  EBL:     BLOOD ADMINISTERED:none  DRAINS: 71F foley to straight drain   LOCAL MEDICATIONS USED:  NONE  SPECIMEN:  No Specimen  DISPOSITION OF SPECIMEN:  N/A  COUNTS:  YES  TOURNIQUET:  * No tourniquets in log *  DICTATION: .Other Dictation: Dictation Number [id number]  PLAN OF CARE: Discharge to home after PACU  PATIENT DISPOSITION:  PACU - hemodynamically stable.   Delay start of Pharmacological VTE agent (>24hrs) due to surgical blood loss or risk of bleeding: not applicable

## 2012-10-12 NOTE — Transfer of Care (Signed)
Immediate Anesthesia Transfer of Care Note  Patient: [name]  Procedure(s) Performed: Procedure(s) with comments: CYSTOSCOPY WITH URETHRAL DILATATION (N/A) - cysto, retrograde, urethrogram, balloon dilation of urethreal stricture  Patient Location: PACU  Anesthesia Type:General  Level of Consciousness: awake, alert , oriented and patient cooperative  Airway & Oxygen Therapy: Patient Spontanous Breathing and Patient connected to face mask oxygen  Post-op Assessment: Report given to PACU RN, Post -op Vital signs reviewed and stable and Patient moving all extremities  Post vital signs: Reviewed and stable  Complications: No apparent anesthesia complications

## 2012-10-13 ENCOUNTER — Encounter (HOSPITAL_COMMUNITY): Payer: Self-pay | Admitting: Urology

## 2012-10-13 NOTE — Op Note (Signed)
[name], [name] NO.:  [id number]  MEDICAL RECORD NO.:  [id number]  LOCATION:                                 FACILITY:  PHYSICIAN:  Sebastian Ache, MD     DATE OF BIRTH:  [birth year]  DATE OF PROCEDURE: 10/12/2012 DATE OF DISCHARGE:                              OPERATIVE REPORT   PREOPERATIVE DIAGNOSES:  Recurrent urethral stricture.  History of kidney stones.  PROCEDURE: 1. Retrograde urethrogram. 2. Balloon dilation of urethra. 3. Bilateral retrograde pyelograms with cystoscopy.  ESTIMATED BLOOD LOSS:  Nil.  COMPLICATIONS:  None.  SPECIMENS:  None.  FINDINGS: 1. High-grade multifocal urethral stricture, most prominent in the mid     penile shaft.  Had apparent proximal anastomosis to prior     hypospadias repair. 2. Unremarkable bilateral pyelograms. 3. 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  INDICATION:  [name] is a pleasant 47 year old gentleman with unfortunate history of hypospadias and recurrent urethral stricture.  He has had multiple dilations in the past year.  He was referred for definitive urethroplasty, but failed to follow up.  He presented to the office today with recurrent impending obstruction with dribbling only small amounts of urine with sensation of PVR and lower abdominal pain. He also has history of nephrolithiasis.  Options were discussed including observation as he is still passing some urine versus operative endoscopy with repeat urethral dilation and retrograde pyelogram to verify no kidney stones contributing to the patient's pain.  Informed consent was obtained and placed in medical record, he wished to proceed.  PROCEDURE IN DETAIL:  The patient being [facility], procedure being cystoscopy, retrograde urethrogram, retrograde pyelogram were confirmed. Procedure was carried out.  Time-out was performed.  Intravenous antibiotics were administered.  General LMA  anesthesia was induced.  The patient was placed to a low lithotomy position.  Sterile field was created by prepping and draping the patient's penis, perineum, proximal thighs using iodine x3.  A 12-French Foley catheter was placed into the fossa navicularis of the hypospadiac urethra, gently filled with 0.5 mL of sterile water. Retrograde urethrogram was then obtained.  Retrograde urethrogram revealed multifocal narrowing of the pendulous urethra.  As expected, contrast did reach the urinary bladder.  Urethroscopy was performed of the distal urethra and as inspected, high- grade stricture was seen at the level of the mid shaft. Photodocumentation was performed.  A 0.038 Sensor wire was advanced. Level of urinary bladder was verified by fluoroscopy.  The 24-French balloon dilation apparatus was carefully placed across the area of the bladder neck, inflated to a pressure of 18 atmospheres, held for 90 seconds, then backed up, spread across the area of the distal urethra and overlapped previous dilation, re-inflated to 18 atmospheres, held for 90 seconds, and then pulled back.  Cystoscopy was performed to the entire length of the urethra and insertion to the urinary bladder.  Following dilation, there appeared to be excellent patency of the entire length of the urethra with multifocal narrowing, most prominent in the pendulous urethra.  Inspection of urinary bladder revealed multifocal diverticula.  Ureteral orifices in the normal anatomic position.  There  was no evidence of bladder stones. Attention was then directed to retrograde pyelogram to help rule out obstructing distal ureteral stone given the patient's symptomatology.  The left ureter was cannulated with a 5-French end-hole catheter and left retrograde pyelogram was seen.  Left retrograde pyelogram demonstrated a single left ureter, single system left kidney.  No filling defects or narrowing noted.  Similarly, right  retrograde pyelogram was obtained.  Retrograde pyelogram demonstrated single right ureter, single system right kidney.  No filling defects or narrowing noted.  The cystoscope was removed, and a 20-French Councill catheter was placed over the Sensor working wire to level of the urinary bladder.  This was again verified by fluoroscopy.  There was efflux of copious yellow urine.  10 mL of sterile water was placed in the balloon.  Catheter was used for straight drain.  Procedure was then terminated.  The patient tolerated the procedure well.  There were no immediate periprocedural complications.  The patient was taken to postanesthesia care unit in stable condition.          ______________________________ Sebastian Ache, MD     TM/MEDQ  D:  10/12/2012  T:  10/13/2012  Job:  [id number]

## 2013-01-14 DIAGNOSIS — Q549 Hypospadias, unspecified: Secondary | ICD-10-CM | POA: Insufficient documentation

## 2013-01-14 DIAGNOSIS — N4889 Other specified disorders of penis: Secondary | ICD-10-CM | POA: Insufficient documentation

## 2013-03-12 ENCOUNTER — Emergency Department: Payer: Self-pay | Admitting: Emergency Medicine

## 2013-03-12 LAB — URINALYSIS, COMPLETE
Bilirubin,UR: NEGATIVE
Glucose,UR: 150 mg/dL (ref 0–75)
NITRITE: POSITIVE
Ph: 5 (ref 4.5–8.0)
SPECIFIC GRAVITY: 1.016 (ref 1.003–1.030)
Squamous Epithelial: 1
WBC UR: 130 /HPF (ref 0–5)

## 2014-07-04 ENCOUNTER — Ambulatory Visit
Admission: RE | Admit: 2014-07-04 | Discharge: 2014-07-04 | Disposition: A | Discharge status: Home / Self Care | Payer: 59 | Source: Ambulatory Visit | Attending: Nurse Practitioner | Admitting: Nurse Practitioner

## 2014-07-04 ENCOUNTER — Other Ambulatory Visit: Payer: Self-pay | Admitting: Nurse Practitioner

## 2014-07-04 DIAGNOSIS — R197 Diarrhea, unspecified: Secondary | ICD-10-CM

## 2015-04-27 ENCOUNTER — Ambulatory Visit (INDEPENDENT_AMBULATORY_CARE_PROVIDER_SITE_OTHER): Payer: Managed Care, Other (non HMO) | Admitting: Family Medicine

## 2015-04-27 ENCOUNTER — Encounter: Payer: Self-pay | Admitting: Family Medicine

## 2015-04-27 VITALS — BP 126/86 | HR 72 | Temp 97.9°F | Ht 70.0 in | Wt 177.0 lb

## 2015-04-27 DIAGNOSIS — E785 Hyperlipidemia, unspecified: Secondary | ICD-10-CM | POA: Diagnosis not present

## 2015-04-27 DIAGNOSIS — M65312 Trigger thumb, left thumb: Secondary | ICD-10-CM | POA: Diagnosis not present

## 2015-04-27 DIAGNOSIS — Z125 Encounter for screening for malignant neoplasm of prostate: Secondary | ICD-10-CM | POA: Diagnosis not present

## 2015-04-27 DIAGNOSIS — Z130 Encounter for screening for diseases of the blood and blood-forming organs and certain disorders involving the immune mechanism: Secondary | ICD-10-CM

## 2015-04-27 DIAGNOSIS — E119 Type 2 diabetes mellitus without complications: Secondary | ICD-10-CM

## 2015-04-27 DIAGNOSIS — G43709 Chronic migraine without aura, not intractable, without status migrainosus: Secondary | ICD-10-CM

## 2015-04-27 DIAGNOSIS — Z1211 Encounter for screening for malignant neoplasm of colon: Secondary | ICD-10-CM

## 2015-04-27 DIAGNOSIS — I10 Essential (primary) hypertension: Secondary | ICD-10-CM | POA: Diagnosis not present

## 2015-04-27 DIAGNOSIS — Z0000 Encounter for general adult medical examination without abnormal findings: Secondary | ICD-10-CM | POA: Diagnosis not present

## 2015-04-27 LAB — COMPREHENSIVE METABOLIC PANEL
ALT: 33 U/L (ref 0–53)
AST: 23 U/L (ref 0–37)
Albumin: 4.7 g/dL (ref 3.5–5.2)
Alkaline Phosphatase: 29 U/L — ABNORMAL LOW (ref 39–117)
BUN: 15 mg/dL (ref 6–23)
CHLORIDE: 103 meq/L (ref 96–112)
CO2: 26 meq/L (ref 19–32)
Calcium: 9.8 mg/dL (ref 8.4–10.5)
Creatinine, Ser: 1.05 mg/dL (ref 0.40–1.50)
GFR: 79.12 mL/min (ref >60.00)
GLUCOSE: 114 mg/dL — AB (ref 70–99)
POTASSIUM: 4.3 meq/L (ref 3.5–5.1)
SODIUM: 139 meq/L (ref 135–145)
Total Bilirubin: 0.6 mg/dL (ref 0.2–1.2)
Total Protein: 7.5 g/dL (ref 6.0–8.3)

## 2015-04-27 LAB — LIPID PANEL
Cholesterol: 225 mg/dL — ABNORMAL HIGH (ref 0–200)
HDL: 57.1 mg/dL (ref >39.00)
LDL CALC: 158 mg/dL — AB (ref 0–99)
NONHDL: 167.75
Total CHOL/HDL Ratio: 4
Triglycerides: 51 mg/dL (ref 0.0–149.0)
VLDL: 10.2 mg/dL (ref 0.0–40.0)

## 2015-04-27 LAB — CBC
HEMATOCRIT: 41.3 % (ref 39.0–52.0)
HEMOGLOBIN: 13.9 g/dL (ref 13.0–17.0)
MCHC: 33.8 g/dL (ref 30.0–36.0)
MCV: 87.9 fl (ref 78.0–100.0)
Platelets: 236 10*3/uL (ref 150.0–400.0)
RBC: 4.69 Mil/uL (ref 4.22–5.81)
RDW: 14.1 % (ref 11.5–15.5)
WBC: 7 10*3/uL (ref 4.0–10.5)

## 2015-04-27 LAB — MICROALBUMIN / CREATININE URINE RATIO
CREATININE, U: 29.9 mg/dL
Microalb Creat Ratio: 2.3 mg/g (ref 0.0–30.0)
Microalb, Ur: <0.7 mg/dL (ref 0.0–1.9)

## 2015-04-27 LAB — HEMOGLOBIN A1C: HEMOGLOBIN A1C: 6.5 % (ref 4.6–6.5)

## 2015-04-27 LAB — PSA: PSA: 0.36 ng/mL (ref 0.10–4.00)

## 2015-04-27 MED ORDER — NEBIVOLOL HCL 10 MG PO TABS: 10.0000 mg | ORAL_TABLET | Freq: Every day | ORAL | Status: DC

## 2015-04-27 MED ORDER — SUMATRIPTAN SUCCINATE 50 MG PO TABS: 50.0000 mg | ORAL_TABLET | ORAL | Status: DC | PRN

## 2015-04-27 MED ORDER — RIZATRIPTAN BENZOATE 10 MG PO TABS: 10.0000 mg | ORAL_TABLET | ORAL | Status: DC | PRN

## 2015-04-27 NOTE — Patient Instructions (Signed)
It was nice to see you today.  We will call your lab results.  Using over-the-counter antihistamine for your postnasal drip (zyrtec, allegra, or claritin).  Go and get your xray at [facility].  We will call with your appointment for your colonoscopy.  Follow up:  6 months.  Take care  Dr. Lacinda Axon  Health Maintenance, Male A healthy lifestyle and preventative care can promote health and wellness.  Maintain regular health, dental, and eye exams.  Eat a healthy diet. Foods like vegetables, fruits, whole grains, low-fat dairy products, and lean protein foods contain the nutrients you need and are low in calories. Decrease your intake of foods high in solid fats, added sugars, and salt. Get information about a proper diet from your health care provider, if necessary.  Regular physical exercise is one of the most important things you can do for your health. Most adults should get at least 150 minutes of moderate-intensity exercise (any activity that increases your heart rate and causes you to sweat) each week. In addition, most adults need muscle-strengthening exercises on 2 or more days a week.   Maintain a healthy weight. The body mass index (BMI) is a screening tool to identify possible weight problems. It provides an estimate of body fat based on height and weight. Your health care provider can find your BMI and can help you achieve or maintain a healthy weight. For males 20 years and older:  A BMI below 18.5 is considered underweight.  A BMI of 18.5 to 24.9 is normal.  A BMI of 25 to 29.9 is considered overweight.  A BMI of 30 and above is considered obese.  Maintain normal blood lipids and cholesterol by exercising and minimizing your intake of saturated fat. Eat a balanced diet with plenty of fruits and vegetables. Blood tests for lipids and cholesterol should begin at age 62 and be repeated every 5 years. If your lipid or cholesterol levels are high, you are over age 1, or you  are at high risk for heart disease, you may need your cholesterol levels checked more frequently.Ongoing high lipid and cholesterol levels should be treated with medicines if diet and exercise are not working.  If you smoke, find out from your health care provider how to quit. If you do not use tobacco, do not start.  Lung cancer screening is recommended for adults aged 77-80 years who are at high risk for developing lung cancer because of a history of smoking. A yearly low-dose CT scan of the lungs is recommended for people who have at least a 30-pack-year history of smoking and are current smokers or have quit within the past 15 years. A pack year of smoking is smoking an average of 1 pack of cigarettes a day for 1 year (for example, a 30-pack-year history of smoking could mean smoking 1 pack a day for 30 years or 2 packs a day for 15 years). Yearly screening should continue until the smoker has stopped smoking for at least 15 years. Yearly screening should be stopped for people who develop a health problem that would prevent them from having lung cancer treatment.  If you choose to drink alcohol, do not have more than 2 drinks per day. One drink is considered to be 12 oz (360 mL) of beer, 5 oz (150 mL) of wine, or 1.5 oz (45 mL) of liquor.  Avoid the use of street drugs. Do not share needles with anyone. Ask for help if you need support or instructions  about stopping the use of drugs.  High blood pressure causes heart disease and increases the risk of stroke. High blood pressure is more likely to develop in:  People who have blood pressure in the end of the normal range (100-139/85-89 mm Hg).  People who are overweight or obese.  People who are African American.  If you are 38-95 years of age, have your blood pressure checked every 3-5 years. If you are 88 years of age or older, have your blood pressure checked every year. You should have your blood pressure measured twice--once when you are at  a hospital or clinic, and once when you are not at a hospital or clinic. Record the average of the two measurements. To check your blood pressure when you are not at a hospital or clinic, you can use:  An automated blood pressure machine at a pharmacy.  A home blood pressure monitor.  If you are 68-20 years old, ask your health care provider if you should take aspirin to prevent heart disease.  Diabetes screening involves taking a blood sample to check your fasting blood sugar level. This should be done once every 3 years after age 46 if you are at a normal weight and without risk factors for diabetes. Testing should be considered at a younger age or be carried out more frequently if you are overweight and have at least 1 risk factor for diabetes.  Colorectal cancer can be detected and often prevented. Most routine colorectal cancer screening begins at the age of 55 and continues through age 90. However, your health care provider may recommend screening at an earlier age if you have risk factors for colon cancer. On a yearly basis, your health care provider may provide home test kits to check for hidden blood in the stool. A small camera at the end of a tube may be used to directly examine the colon (sigmoidoscopy or colonoscopy) to detect the earliest forms of colorectal cancer. Talk to your health care provider about this at age 68 when routine screening begins. A direct exam of the colon should be repeated every 5-10 years through age 42, unless early forms of precancerous polyps or small growths are found.  People who are at an increased risk for hepatitis B should be screened for this virus. You are considered at high risk for hepatitis B if:  You were born in a country where hepatitis B occurs often. Talk with your health care provider about which countries are considered high risk.  Your parents were born in a high-risk country and you have not received a shot to protect against hepatitis B  (hepatitis B vaccine).  You have HIV or AIDS.  You use needles to inject street drugs.  You live with, or have sex with, someone who has hepatitis B.  You are a man who has sex with other men (MSM).  You get hemodialysis treatment.  You take certain medicines for conditions like cancer, organ transplantation, and autoimmune conditions.  Hepatitis C blood testing is recommended for all people born from [birth year] through 1966 and any individual with known risk factors for hepatitis C.  Healthy men should no longer receive prostate-specific antigen (PSA) blood tests as part of routine cancer screening. Talk to your health care provider about prostate cancer screening.  Testicular cancer screening is not recommended for adolescents or adult males who have no symptoms. Screening includes self-exam, a health care provider exam, and other screening tests. Consult with your health care provider  about any symptoms you have or any concerns you have about testicular cancer.  Practice safe sex. Use condoms and avoid high-risk sexual practices to reduce the spread of sexually transmitted infections (STIs).  You should be screened for STIs, including gonorrhea and chlamydia if:  You are sexually active and are younger than 24 years.  You are older than 24 years, and your health care provider tells you that you are at risk for this type of infection.  Your sexual activity has changed since you were last screened, and you are at an increased risk for chlamydia or gonorrhea. Ask your health care provider if you are at risk.  If you are at risk of being infected with HIV, it is recommended that you take a prescription medicine daily to prevent HIV infection. This is called pre-exposure prophylaxis (PrEP). You are considered at risk if:  You are a man who has sex with other men (MSM).  You are a heterosexual man who is sexually active with multiple partners.  You take drugs by injection.  You are  sexually active with a partner who has HIV.  Talk with your health care provider about whether you are at high risk of being infected with HIV. If you choose to begin PrEP, you should first be tested for HIV. You should then be tested every 3 months for as long as you are taking PrEP.  Use sunscreen. Apply sunscreen liberally and repeatedly throughout the day. You should seek shade when your shadow is shorter than you. Protect yourself by wearing long sleeves, pants, a wide-brimmed hat, and sunglasses year round whenever you are outdoors.  Tell your health care provider of new moles or changes in moles, especially if there is a change in shape or color. Also, tell your health care provider if a mole is larger than the size of a pencil eraser.  A one-time screening for abdominal aortic aneurysm (AAA) and surgical repair of large AAAs by ultrasound is recommended for men aged 46-75 years who are current or former smokers.  Stay current with your vaccines (immunizations).   This information is not intended to replace advice given to you by your health care provider. Make sure you discuss any questions you have with your health care provider.   Document Released: 06/21/2007 Document Revised: 01/13/2014 Document Reviewed: 05/20/2010 Elsevier Interactive Patient Education [employer].

## 2015-04-27 NOTE — Progress Notes (Signed)
Pre visit review using our clinic review tool, if applicable. No additional management support is needed unless otherwise documented below in the visit note. 

## 2015-04-29 ENCOUNTER — Encounter: Payer: Self-pay | Admitting: Family Medicine

## 2015-04-29 DIAGNOSIS — M65319 Trigger thumb, unspecified thumb: Secondary | ICD-10-CM | POA: Insufficient documentation

## 2015-04-29 DIAGNOSIS — E785 Hyperlipidemia, unspecified: Secondary | ICD-10-CM | POA: Insufficient documentation

## 2015-04-29 DIAGNOSIS — G43909 Migraine, unspecified, not intractable, without status migrainosus: Secondary | ICD-10-CM | POA: Insufficient documentation

## 2015-04-29 DIAGNOSIS — E119 Type 2 diabetes mellitus without complications: Secondary | ICD-10-CM | POA: Insufficient documentation

## 2015-04-29 DIAGNOSIS — Z0000 Encounter for general adult medical examination without abnormal findings: Secondary | ICD-10-CM | POA: Insufficient documentation

## 2015-04-29 DIAGNOSIS — I10 Essential (primary) hypertension: Secondary | ICD-10-CM | POA: Insufficient documentation

## 2015-04-29 MED ORDER — METFORMIN HCL 500 MG PO TABS: 500.0000 mg | ORAL_TABLET | Freq: Two times a day (BID) | ORAL | Status: DC

## 2015-04-29 NOTE — Assessment & Plan Note (Signed)
Well controlled. Refilled Bystolic.

## 2015-04-29 NOTE — Assessment & Plan Note (Signed)
A1c 6.5. Will refill Metformin.

## 2015-04-29 NOTE — Assessment & Plan Note (Signed)
New problem. Recommended xray and suggested referral to ortho for injection. Patient declined referral.

## 2015-04-29 NOTE — Assessment & Plan Note (Signed)
Declines immunizations. Referring for colonoscopy. Labs today including PSA.

## 2015-04-29 NOTE — Assessment & Plan Note (Signed)
Stable. Refilled meds today (he uses two different triptans as he insurance only allows for a certain # of pills/month).

## 2015-04-29 NOTE — Progress Notes (Signed)
Subjective:  Patient ID: [name], male    DOB: [birth year]  Age: 50 y.o. MRN: [id number]  CC: Establish care  HPI [name] is a 50 y.o. male presents to the clinic today to establish care. Additional concerns below.  Preventative Healthcare  Colonoscopy: In need of. Patient in agreement. Will arrange.  Immunizations  Tetanus - Declined.   Pneumococcal - Declined.   Flu - Not indicated at this time.  Prostate cancer screening: Discussed. Would like prostate cancer screening.   Labs: Screening labs today.   Alcohol use: See below.  Smoking/tobacco use: Nonsmoker.  Wears seat belt: Yes.  Hypertension  Well controlled on Bystolic.  DM  Blood sugars readings - Not checking.  Hypoglycemia - No.  Medications - Was on Metformin. Has been out for a while.  Adverse effects - None.  Compliance - Has been compliant just out of medication. Preventative care  Eye exam - In need of. Patient states he will be later this year.   Foot exam - Will perform today.  Last A1C - Unclear of last A1C and records are unavailable.  Urine microalbumin - In need of.  Candidate for aspirin - ASCVD 8.4%. USPSTF recommends if risk is >10%.  Candidate for statin - Yes.   Left thumb pain  Has been going on for quite some time.  He reports difficulty flexing the thumb.  He has a snap when flexing.  Pain is severe at times.  No relieving factors.  No interventions tried.  PMH, Surgical Hx, Family Hx, Social History reviewed and updated as below.  Past Medical History  Diagnosis Date  . History of pulmonary embolus (PE) POST SURG. 2008  . Chronic urethral stricture HX DILATION   . Headache, classical migraine   . Seasonal allergies   . Diabetes mellitus without complication ([place])   . Hypertension    Past Surgical History  Procedure Laterality Date  . Cysto/ retrograde urethrogram/ balloon dilation of urethral stricture  X5 LAST ONE 12-05-2011    FEB  2011/  2009 /  2008  . Cervical fusion  2004    C4 - 7  . Inguinal hernia repair  FEB 2011    LEFT  . Cystoscopy with urethral dilatation  05/16/2011    Procedure: CYSTOSCOPY WITH URETHRAL DILATATION;  Surgeon: Reece Packer, MD;  Location: [facility];  Service: Urology;  Laterality: N/A;  BALLOON DILATION AND RETROGRADE   . Cystoscopy/retrograde/ureteroscopy/stone extraction with basket  06/20/2011    Procedure: CYSTOSCOPY/RETROGRADE/URETEROSCOPY/STONE EXTRACTION WITH BASKET;  Surgeon: Alexis Frock, MD;  Location: [facility];  Service: Urology;  Laterality: Left;  . Balloon dilation  06/20/2011    Procedure: BALLOON DILATION;  Surgeon: Alexis Frock, MD;  Location: [facility];  Service: Urology;  Laterality: Left;  . Cystoscopy with urethral dilatation  12/30/2011    Procedure: CYSTOSCOPY WITH URETHRAL DILATATION;  Surgeon: Reece Packer, MD;  Location: [facility];  Service: Urology;  Laterality: N/A;  CYSTO, BALLOON DILATION, RETROGRADE URETHROGRAM  . Cystoscopy with urethral dilatation N/A 05/06/2012    Procedure: CYSTOSCOPY WITH BALLOON DILATATION;  Surgeon: Reece Packer, MD;  Location: [facility];  Service: Urology;  Laterality: N/A;  . Hypospadius repair  child  . Cystoscopy with urethral dilatation N/A 08/12/2012    Procedure: CYSTOSCOPY WITH URETHRAL DILATATION  CYSTOSCOPY WITH BALLOON DILATION OF URETHRAL STRICTURE;  Surgeon: Franchot Gallo, MD;  Location: [facility];  Service: Urology;  Laterality: N/A;  . Cystoscopy with  litholapaxy N/A 08/12/2012    Procedure: CYSTOSCOPY WITH LITHOLAPAXY;  Surgeon: Franchot Gallo, MD;  Location: [facility];  Service: Urology;  Laterality: N/A;  . Cystoscopy with urethral dilatation N/A 10/12/2012    Procedure: CYSTOSCOPY WITH URETHRAL DILATATION;  Surgeon: Alexis Frock, MD;  Location: [facility];  Service: Urology;  Laterality: N/A;  cysto, retrograde, urethrogram, balloon dilation of urethreal stricture    . Hypospadias correction      Surgery for hypospadias as child   Family History  Problem Relation Age of Onset  . Lung cancer Mother   . COPD Mother   . Rheum arthritis Mother    Social History  Substance Use Topics  . Smoking status: Passive Smoke Exposure - Never Smoker  . Smokeless tobacco: Never Used  . Alcohol Use: Yes     Comment: OCCASIONAL   Review of Systems  Constitutional: Positive for fever.  Eyes:       Trouble seeing up close.  Respiratory: Positive for cough.   Musculoskeletal: Positive for arthralgias.       Difficulty bending left thumb.  Neurological: Positive for headaches.  All other systems reviewed and are negative.  Objective:   Today's Vitals: BP 126/86 mmHg  Pulse 72  Temp(Src) 97.9 F (36.6 C)  Ht 5\' 10"  (1.778 m)  Wt 177 lb (80.287 kg)  BMI 25.40 kg/m2  Physical Exam   Assessment & Plan:   Problem List Items Addressed This Visit    Preventative health care - Primary    Declines immunizations. Referring for colonoscopy. Labs today including PSA.       Essential hypertension    Well controlled. Refilled Bystolic.       Relevant Medications   nebivolol (BYSTOLIC) 10 MG tablet   Other Relevant Orders   Comprehensive metabolic panel (Completed)   Migraine    Stable. Refilled meds today (he uses two different triptans as he insurance only allows for a certain # of pills/month).      Relevant Medications   SUMAtriptan (IMITREX) 50 MG tablet   rizatriptan (MAXALT) 10 MG tablet   nebivolol (BYSTOLIC) 10 MG tablet   Hyperlipidemia    ASCVD 8.4%. Recommending statin therapy.       Relevant Medications   nebivolol (BYSTOLIC) 10 MG tablet   Other Relevant Orders   Lipid Profile (Completed)   DM type 2 (diabetes mellitus, type 2) (HCC)    A1c 6.5. Will refill Metformin.      Relevant Medications   metFORMIN (GLUCOPHAGE) 500 MG tablet   Other Relevant Orders   HgB A1c (Completed)   Urine Microalbumin w/creat. ratio  (Completed)   Trigger thumb    New problem. Recommended xray and suggested referral to ortho for injection. Patient declined referral.       Other Visit Diagnoses    Screening for deficiency anemia        Relevant Orders    CBC (Completed)    Screening for prostate cancer        Relevant Orders    PSA (Completed)    Encounter for screening colonoscopy        Relevant Orders    Ambulatory referral to Gastroenterology       Outpatient Encounter Prescriptions as of 04/27/2015  Medication Sig  . metFORMIN (GLUCOPHAGE) 500 MG tablet Take 1 tablet (500 mg total) by mouth 2 (two) times daily with a meal.  . nebivolol (BYSTOLIC) 10 MG tablet Take 1 tablet (10 mg total) by mouth daily.  [place] Kitchen  rizatriptan (MAXALT) 10 MG tablet Take 1 tablet (10 mg total) by mouth as needed. May repeat in 2 hours if needed for migraines  . SUMAtriptan (IMITREX) 50 MG tablet Take 1 tablet (50 mg total) by mouth every 2 (two) hours as needed for migraine.  . [DISCONTINUED] metFORMIN (GLUCOPHAGE) 500 MG tablet Take by mouth.  . [DISCONTINUED] nebivolol (BYSTOLIC) 10 MG tablet Take by mouth.  . [DISCONTINUED] rizatriptan (MAXALT) 10 MG tablet Take 10 mg by mouth as needed. May repeat in 2 hours if needed for migraines  . [DISCONTINUED] SUMAtriptan (IMITREX) 50 MG tablet Take 50 mg by mouth every 2 (two) hours as needed for migraine.   . [DISCONTINUED] oxyCODONE-acetaminophen (ROXICET) 5-325 MG per tablet Take 1 tablet by mouth every 4 (four) hours as needed for pain. Postoperatively.  . [DISCONTINUED] senna-docusate (SENOKOT-S) 8.6-50 MG per tablet Take 1 tablet by mouth 2 (two) times daily. While taking pain meds to prevent constipation  . [DISCONTINUED] sulfamethoxazole-trimethoprim (BACTRIM DS) 800-160 MG per tablet Take 1 tablet by mouth 2 (two) times daily. X 5 days to prevent post-op infection  . [DISCONTINUED] triamcinolone (NASACORT) 55 MCG/ACT nasal inhaler Place 2 sprays into the nose daily as needed.    No  facility-administered encounter medications on file as of 04/27/2015.    Follow-up: 6 months.  [place]

## 2015-04-29 NOTE — Assessment & Plan Note (Signed)
ASCVD 8.4%. Recommending statin therapy.

## 2015-04-30 ENCOUNTER — Other Ambulatory Visit: Payer: Self-pay | Admitting: Family Medicine

## 2015-04-30 MED ORDER — ATORVASTATIN CALCIUM 40 MG PO TABS: 40.0000 mg | ORAL_TABLET | Freq: Every day | ORAL | Status: DC

## 2015-05-01 ENCOUNTER — Telehealth: Payer: Self-pay | Admitting: *Deleted

## 2015-05-01 ENCOUNTER — Other Ambulatory Visit: Payer: Self-pay

## 2015-05-01 MED ORDER — LISINOPRIL 10 MG PO TABS: 10.0000 mg | ORAL_TABLET | Freq: Every day | ORAL | Status: DC

## 2015-05-01 NOTE — Telephone Encounter (Signed)
[facility] has requested another Rx in the place of Bystolic,patient can not afford this medication monthly, he received samples for this medication prior to coming to this office. [place] [phone]

## 2015-05-01 NOTE — Telephone Encounter (Signed)
Sent to pharmacy 

## 2015-05-01 NOTE — Telephone Encounter (Signed)
Rx was sent by Ashleigh.

## 2015-05-01 NOTE — Telephone Encounter (Signed)
We can start another medication. Recommend Lisinopril.

## 2015-05-01 NOTE — Telephone Encounter (Signed)
I checked to see if we had any savings/coupon cards but unfortunately we do not.

## 2015-05-01 NOTE — Telephone Encounter (Signed)
Patients wife stated to send medication in that patient needs something.

## 2015-05-14 ENCOUNTER — Encounter: Payer: Self-pay | Admitting: Internal Medicine

## 2015-05-28 ENCOUNTER — Other Ambulatory Visit: Payer: Self-pay | Admitting: Family Medicine

## 2015-06-15 ENCOUNTER — Encounter: Payer: Self-pay | Admitting: Family Medicine

## 2015-06-15 ENCOUNTER — Ambulatory Visit (INDEPENDENT_AMBULATORY_CARE_PROVIDER_SITE_OTHER): Payer: Managed Care, Other (non HMO) | Admitting: Family Medicine

## 2015-06-15 VITALS — BP 112/84 | HR 84 | Temp 98.7°F | Ht 70.0 in | Wt 179.1 lb

## 2015-06-15 DIAGNOSIS — M25512 Pain in left shoulder: Secondary | ICD-10-CM | POA: Diagnosis not present

## 2015-06-15 DIAGNOSIS — R0981 Nasal congestion: Secondary | ICD-10-CM | POA: Diagnosis not present

## 2015-06-15 DIAGNOSIS — M545 Low back pain: Secondary | ICD-10-CM

## 2015-06-15 LAB — POCT URINALYSIS DIPSTICK
Bilirubin, UA: NEGATIVE
Glucose, UA: NEGATIVE
Ketones, UA: NEGATIVE
Leukocytes, UA: NEGATIVE
Nitrite, UA: NEGATIVE
PROTEIN UA: NEGATIVE
RBC UA: NEGATIVE
SPEC GRAV UA: 1.025
Urobilinogen, UA: 0.2
pH, UA: 5.5

## 2015-06-15 NOTE — Patient Instructions (Addendum)
Your urine was clean. We will send it for culture.   Use the claritin and start flonase for your sinuses. No evidence of infection at this time.  Follow up in 6 months to 1 year.   Take care  Dr. Lacinda Axon

## 2015-06-15 NOTE — Progress Notes (Signed)
Pre visit review using our clinic review tool, if applicable. No additional management support is needed unless otherwise documented below in the visit note. 

## 2015-06-17 LAB — URINE CULTURE: Colony Count: 50000

## 2015-06-18 DIAGNOSIS — R0981 Nasal congestion: Secondary | ICD-10-CM | POA: Insufficient documentation

## 2015-06-18 DIAGNOSIS — M25512 Pain in left shoulder: Secondary | ICD-10-CM | POA: Insufficient documentation

## 2015-06-18 DIAGNOSIS — M545 Low back pain, unspecified: Secondary | ICD-10-CM | POA: Insufficient documentation

## 2015-06-18 DIAGNOSIS — M25519 Pain in unspecified shoulder: Secondary | ICD-10-CM | POA: Insufficient documentation

## 2015-06-18 MED ORDER — METHYLPREDNISOLONE ACETATE 80 MG/ML IJ SUSP
80.0000 mg | Freq: Once | INTRAMUSCULAR | Status: AC
  Administered 2015-06-15: 80 mg via INTRA_ARTICULAR

## 2015-06-18 MED ORDER — METHYLPREDNISOLONE ACETATE 80 MG/ML IJ SUSP: 80.0000 mg | Freq: Once | INTRAMUSCULAR | Status: DC

## 2015-06-18 NOTE — Assessment & Plan Note (Signed)
New problem. No evidence of infection. Advised continued use of antihistamines. Add Flonase.

## 2015-06-18 NOTE — Assessment & Plan Note (Signed)
New problem (to me). Injection given today.

## 2015-06-18 NOTE — Assessment & Plan Note (Signed)
New problem. UA normal. Sending for culture. Supportive care.

## 2015-06-18 NOTE — Progress Notes (Signed)
Subjective:  Patient ID: [name], male    DOB: Oct 28, 1965  Age: 50 y.o. MRN: [id number]  CC: ? UTI, Shoulder pain, ? Sinus infection  HPI:  50 year old male presents with the above complaints.  UTI  Patient reports that he's concerned that he may have a UTI.  He states that he's been urinating more frequently.  He denies any dysuria.  Reports that he's had low back pain.  No associated fevers or chills.  Patient states that he had some leftover Cipro and took one pill daily for 3 days.   Sinus issues, concern for infection  Patient reports he's had sinus issues since the end of April.  He states that his nose is dry and often congested.  No reports of purulent nasal discharge.  He reports sinus pressure.  No associated fevers or chills.  No known exacerbating factors.  He's taking over-the-counter allergy medication with no relief.  He is not currently using a nasal spray.  Shoulder pain  Left shoulder.  Hx of rotator cuff injury.   Worsened by activity.  No relieving factors.  No interventions or medications tried.  Would like to discuss injection today.  Social Hx   Social History   Social History  . Marital Status: Married    Spouse Name: N/A  . Number of Children: N/A  . Years of Education: N/A   Social History Main Topics  . Smoking status: Passive Smoke Exposure - Never Smoker  . Smokeless tobacco: Never Used  . Alcohol Use: Yes     Comment: OCCASIONAL  . Drug Use: No  . Sexual Activity: Not Asked   Other Topics Concern  . None   Social History Narrative   Review of Systems  Constitutional: Negative.   HENT: Positive for congestion and sinus pressure.   Genitourinary: Positive for frequency.  Musculoskeletal: Positive for back pain.       Shoulder pain.   Objective:  BP 112/84 mmHg  Pulse 84  Temp(Src) 98.7 F (37.1 C) (Oral)  Ht 5\' 10"  (1.778 m)  Wt 179 lb 2 oz (81.251 kg)  BMI 25.70 kg/m2  SpO2  96%  BP/Weight 06/15/2015 04/27/2015 [id number]  Systolic BP [id number] Q000111Q  Diastolic BP 84 86 97  Wt. (Lbs) 179.13 177 178.5  BMI 25.7 25.4 24.91   Physical Exam  Constitutional: He is oriented to person, place, and time. He appears well-developed. No distress.  HENT:  Head: Normocephalic and atraumatic.  Mouth/Throat: Oropharynx is clear and moist.  Pulmonary/Chest: Effort normal.  Abdominal: He exhibits no distension. There is no tenderness. There is no rebound and no guarding.  Musculoskeletal:  Shoulder: Inspection reveals no abnormalities, atrophy or asymmetry. Palpation is normal with no tenderness over AC joint or bicipital groove. ROM is full in all planes. Rotator cuff strength - 4/5 Infraspinatus/teres minor. No signs of impingement with negative Neer and Hawkin's tests, empty can. No painful arc and no drop arm sign.    Neurological: He is alert and oriented to person, place, and time.  Psychiatric: He has a normal mood and affect.  Vitals reviewed.  Lab Results  Component Value Date   WBC 7.0 04/27/2015   HGB 13.9 04/27/2015   HCT 41.3 04/27/2015   PLT 236.0 04/27/2015   GLUCOSE 114* 04/27/2015   CHOL 225* 04/27/2015   TRIG 51.0 04/27/2015   HDL 57.10 04/27/2015   LDLCALC 158* 04/27/2015   ALT 33 04/27/2015   AST 23 04/27/2015   NA 139  04/27/2015   K 4.3 04/27/2015   CL 103 04/27/2015   CREATININE 1.05 04/27/2015   BUN 15 04/27/2015   CO2 26 04/27/2015   PSA 0.36 04/27/2015   INR 1.8* 06/30/2006   HGBA1C 6.5 04/27/2015   MICROALBUR <0.7 04/27/2015   Assessment & Plan:   Problem List Items Addressed This Visit    Low back pain - Primary    New problem. UA normal. Sending for culture. Supportive care.      Relevant Medications   methylPREDNISolone acetate (DEPO-MEDROL) injection 80 mg (Completed)   Other Relevant Orders   POCT Urinalysis Dipstick (Completed)   Urine culture (Completed)   Shoulder pain   Relevant Medications    methylPREDNISolone acetate (DEPO-MEDROL) injection 80 mg (Completed)   Sinus congestion    New problem. No evidence of infection. Advised continued use of antihistamines. Add Flonase.         Meds ordered this encounter  Medications  . DISCONTD: methylPREDNISolone acetate (DEPO-MEDROL) injection 80 mg    Sig:   . methylPREDNISolone acetate (DEPO-MEDROL) injection 80 mg    Sig:    Procedure: L Subacromial bursa injection Consent signed and scanned into record. Medication: 1 mL (80 mg) of Depo medrol and 4 mL of Lidocaine 1% w/o epi Preparation: area cleansed with alcohol x 3 Time Out taken  Injection  Landmarks identified Above medication injected using a standard posterior approach. Patient tolerated well without bleeding or paresthesias  Patient had good range of motion of joint after injection  Follow-up: 6 months to 1 year.  [place]

## 2015-07-11 ENCOUNTER — Other Ambulatory Visit: Payer: Self-pay | Admitting: Family Medicine

## 2015-07-11 NOTE — Telephone Encounter (Signed)
Refilled 04/2015 with 6 tablets. Last seen 06/15/15. Please advise?

## 2015-07-20 ENCOUNTER — Ambulatory Visit (AMBULATORY_SURGERY_CENTER): Payer: Self-pay | Admitting: *Deleted

## 2015-07-20 VITALS — Ht 71.0 in | Wt 174.0 lb

## 2015-07-20 DIAGNOSIS — Z1211 Encounter for screening for malignant neoplasm of colon: Secondary | ICD-10-CM

## 2015-07-20 MED ORDER — NA SULFATE-K SULFATE-MG SULF 17.5-3.13-1.6 GM/177ML PO SOLN: 1.0000 | Freq: Once | ORAL | Status: DC

## 2015-07-20 NOTE — Progress Notes (Signed)
No egg or soy allergy. No anesthesia problems.  No home O2.  No diet meds.  

## 2015-07-23 ENCOUNTER — Encounter: Payer: Self-pay | Admitting: Internal Medicine

## 2015-08-03 ENCOUNTER — Ambulatory Visit (AMBULATORY_SURGERY_CENTER): Payer: Managed Care, Other (non HMO) | Admitting: Internal Medicine

## 2015-08-03 ENCOUNTER — Encounter: Payer: Self-pay | Admitting: Internal Medicine

## 2015-08-03 VITALS — BP 97/71 | HR 73 | Temp 98.2°F | Resp 13 | Wt 174.0 lb

## 2015-08-03 DIAGNOSIS — D125 Benign neoplasm of sigmoid colon: Secondary | ICD-10-CM

## 2015-08-03 DIAGNOSIS — Z1211 Encounter for screening for malignant neoplasm of colon: Secondary | ICD-10-CM | POA: Diagnosis present

## 2015-08-03 LAB — GLUCOSE, CAPILLARY
Glucose-Capillary: 107 mg/dL — ABNORMAL HIGH (ref 65–99)
Glucose-Capillary: 120 mg/dL — ABNORMAL HIGH (ref 65–99)

## 2015-08-03 NOTE — Progress Notes (Signed)
Called to room for pathology. 

## 2015-08-03 NOTE — Patient Instructions (Addendum)

## 2015-08-03 NOTE — Progress Notes (Signed)
Report to PACU, RN, vss, BBS= Clear.  

## 2015-08-03 NOTE — Op Note (Signed)
[place] Patient Name: [name] Procedure Date: 08/03/2015 8:37 AM MRN: [id number] Endoscopist: Docia Chuck. Henrene Pastor , MD Age: 50 Referring MD:  Date of Birth: Sep 13, 1965 Gender: Male Account #: [id number] Procedure:                Colonoscopy, with cold snare polypectomy x 1 Indications:              Screening for colorectal malignant neoplasm Medicines:                Monitored Anesthesia Care Procedure:                Pre-Anesthesia Assessment:                           - Prior to the procedure, a History and Physical                            was performed, and patient medications and                            allergies were reviewed. The patient's tolerance of                            previous anesthesia was also reviewed. The risks                            and benefits of the procedure and the sedation                            options and risks were discussed with the patient.                            All questions were answered, and informed consent                            was obtained. Prior Anticoagulants: The patient has                            taken no previous anticoagulant or antiplatelet                            agents. ASA Grade Assessment: II - A patient with                            mild systemic disease. After reviewing the risks                            and benefits, the patient was deemed in                            satisfactory condition to undergo the procedure.                           After obtaining informed consent, the colonoscope  was passed under direct vision. Throughout the                            procedure, the patient's blood pressure, pulse, and                            oxygen saturations were monitored continuously. The                            [id number]) was introduced through the anus                            and advanced to the the cecum, identified by            appendiceal orifice and ileocecal valve. The                            ileocecal valve, appendiceal orifice, and rectum                            were photographed. The quality of the bowel                            preparation was excellent. The colonoscopy was                            performed without difficulty. The patient tolerated                            the procedure well. The bowel preparation used was                            SUPREP. Scope In: 8:48:24 AM Scope Out: 9:00:11 AM Scope Withdrawal Time: 0 hours 10 minutes 19 seconds  Total Procedure Duration: 0 hours 11 minutes 47 seconds  Findings:                 A 2 mm polyp was found in the sigmoid colon. The                            polyp was removed with a cold snare. Resection and                            retrieval were complete.                           A few small-mouthed diverticula were found in the                            sigmoid colon.                           Internal hemorrhoids were found during retroflexion.                           The  exam was otherwise without abnormality on                            direct and retroflexion views. Complications:            No immediate complications. Estimated blood loss:                            None. Estimated Blood Loss:     Estimated blood loss: none. Impression:               - One 2 mm polyp in the sigmoid colon, removed with                            a cold snare. Resected and retrieved.                           - Diverticulosis in the sigmoid colon.                           - Internal hemorrhoids.                           - The examination was otherwise normal on direct                            and retroflexion views. Recommendation:           - Repeat colonoscopy in 5-10 years for surveillance.                           - Patient has a contact number available for                            emergencies. The signs and symptoms of  potential                            delayed complications were discussed with the                            patient. Return to normal activities tomorrow.                            Written discharge instructions were provided to the                            patient.                           - Resume previous diet.                           - Continue present medications.                           - Await pathology results. Docia Chuck. Henrene Pastor, MD 08/03/2015 9:03:38 AM This report has been signed electronically.

## 2015-08-06 ENCOUNTER — Telehealth: Payer: Self-pay

## 2015-08-06 NOTE — Telephone Encounter (Signed)
  Follow up Call-  Call back number 08/03/2015  Post procedure Call Back phone  # [phone]  Permission to leave phone message Yes  Some recent data might be hidden     Patient questions:  Do you have a fever, pain , or abdominal swelling? No. Pain Score  0 *  Have you tolerated food without any problems? Yes.    Have you been able to return to your normal activities? Yes.    Do you have any questions about your discharge instructions: Diet   No. Medications  No. Follow up visit  No.  Do you have questions or concerns about your Care? No.  Actions: * If pain score is 4 or above: No action needed, pain <4.

## 2015-08-08 ENCOUNTER — Encounter: Payer: Self-pay | Admitting: Internal Medicine

## 2015-09-18 ENCOUNTER — Other Ambulatory Visit: Payer: Self-pay | Admitting: Family Medicine

## 2015-09-19 NOTE — Telephone Encounter (Signed)
Refilled 07/12/15. Pt last seen 06/15/15. Please advise?

## 2015-10-23 ENCOUNTER — Ambulatory Visit (INDEPENDENT_AMBULATORY_CARE_PROVIDER_SITE_OTHER): Payer: Managed Care, Other (non HMO) | Admitting: Family Medicine

## 2015-10-23 ENCOUNTER — Encounter: Payer: Self-pay | Admitting: Family Medicine

## 2015-10-23 VITALS — BP 142/86 | HR 101 | Temp 98.4°F | Wt 181.2 lb

## 2015-10-23 DIAGNOSIS — M25511 Pain in right shoulder: Secondary | ICD-10-CM | POA: Diagnosis not present

## 2015-10-23 DIAGNOSIS — B820 Intestinal helminthiasis, unspecified: Secondary | ICD-10-CM | POA: Diagnosis not present

## 2015-10-23 MED ORDER — ALBENDAZOLE 200 MG PO TABS: 400.0000 mg | ORAL_TABLET | Freq: Once | ORAL | 0 refills | Status: AC

## 2015-10-23 MED ORDER — METHYLPREDNISOLONE ACETATE 80 MG/ML IJ SUSP
80.0000 mg | Freq: Once | INTRAMUSCULAR | Status: AC
  Administered 2015-10-23: 80 mg via INTRA_ARTICULAR

## 2015-10-23 NOTE — Assessment & Plan Note (Signed)
New problem. Hx of cuff arthropathy. Injection given today.

## 2015-10-23 NOTE — Progress Notes (Signed)
Pre visit review using our clinic review tool, if applicable. No additional management support is needed unless otherwise documented below in the visit note. 

## 2015-10-23 NOTE — Progress Notes (Signed)
Subjective:  Patient ID: [name], male    DOB: [birth year]  Age: 50 y.o. MRN: [id number]  CC: Shoulder pain, ? Worm in stool  HPI:  50 year old male presents with the above complaints.  R shoulder pain  Hx of rotator cuff arthropathy.  Was throwing ball with his daughter on Thursday.  He states that the following day he had significant pain and decreased range of motion.  Exacerbated by range of motion. Relieved by rest.  No medications or interventions tried.  He would like to discuss injection treatment today..  ? Worm in stool  Patient states that this morning he had a bowel movement and noticed a clear/white "worm" in his stool.  He has a video of this today.  No reports of weight loss, abdominal pain, or other symptoms.  He does eat rare meats.   Social Hx   Social History   Social History  . Marital status: Married    Spouse name: N/A  . Number of children: N/A  . Years of education: N/A   Social History Main Topics  . Smoking status: Passive Smoke Exposure - Never Smoker  . Smokeless tobacco: Never Used  . Alcohol use 0.0 oz/week     Comment: OCCASIONAL  . Drug use: No  . Sexual activity: Not Asked   Other Topics Concern  . None   Social History Narrative  . None   Review of Systems  Constitutional: Negative.   Musculoskeletal:       Shoulder pain, R.   Objective:  BP (!) 142/86 (BP Location: Right Arm, Patient Position: Sitting, Cuff Size: Normal)   Pulse (!) 101   Temp 98.4 F (36.9 C) (Oral)   Wt 181 lb 4 oz (82.2 kg)   SpO2 96%   BMI 25.28 kg/m   BP/Weight 10/23/2015 08/03/2015 [id number]  Systolic BP A999333 97 -  Diastolic BP 86 71 -  Wt. (Lbs) 181.25 174 174  BMI 25.28 24.27 24.28   Physical Exam  Constitutional: He is oriented to person, place, and time. He appears well-developed. No distress.  Pulmonary/Chest: Effort normal.  Musculoskeletal:  Shoulder: Right Inspection reveals no abnormalities, atrophy or  asymmetry. Palpation is normal with no tenderness over AC joint or bicipital groove. ROM slightly decreased in flexion and abduction. Rotator cuff strength - supraspinatus 4/5. Remainder of cuff muscles normal.   Neurological: He is alert and oriented to person, place, and time.  Psychiatric: He has a normal mood and affect.  Vitals reviewed.  Lab Results  Component Value Date   WBC 7.0 04/27/2015   HGB 13.9 04/27/2015   HCT 41.3 04/27/2015   PLT 236.0 04/27/2015   GLUCOSE 114 (H) 04/27/2015   CHOL 225 (H) 04/27/2015   TRIG 51.0 04/27/2015   HDL 57.10 04/27/2015   LDLCALC 158 (H) 04/27/2015   ALT 33 04/27/2015   AST 23 04/27/2015   NA 139 04/27/2015   K 4.3 04/27/2015   CL 103 04/27/2015   CREATININE 1.05 04/27/2015   BUN 15 04/27/2015   CO2 26 04/27/2015   PSA 0.36 04/27/2015   INR 1.8 (H) 06/30/2006   HGBA1C 6.5 04/27/2015   MICROALBUR <0.7 04/27/2015    Assessment & Plan:   Problem List Items Addressed This Visit    Right shoulder pain - Primary    New problem. Hx of cuff arthropathy. Injection given today.      Relevant Medications   methylPREDNISolone acetate (DEPO-MEDROL) injection 80 mg (Completed)   Intestinal worms  New problem. Video reviewed. ? Worm vs mucous.  Will obtain stool O&P. Treating empirically with Albendazole.       Relevant Orders   Ova and parasite examination    Other Visit Diagnoses   None.    Procedure: Subacromial bursa injection Consent signed and will be scanned into record. Medication:  80 mg of Depo Medrol and 4 mL of 1% Lidocaine w/o Epi Preparation: area cleansed with alcohol Time Out taken  Injection  Landmarks identified Above medication injected using a standard posterior approach. Patient tolerated well without bleeding or paresthesias  Patient had good range of motion of joint after injection  Meds ordered this encounter  Medications  . albendazole (ALBENZA) 200 MG tablet    Sig: Take 2 tablets (400 mg  total) by mouth once.    Dispense:  2 tablet    Refill:  0  . methylPREDNISolone acetate (DEPO-MEDROL) injection 80 mg   Follow-up: PRN  [place]

## 2015-10-23 NOTE — Assessment & Plan Note (Signed)
New problem. Video reviewed. ? Worm vs mucous.  Will obtain stool O&P. Treating empirically with Albendazole.

## 2015-10-24 ENCOUNTER — Other Ambulatory Visit: Payer: Managed Care, Other (non HMO)

## 2015-10-25 LAB — OVA AND PARASITE EXAMINATION

## 2015-10-26 ENCOUNTER — Ambulatory Visit: Payer: Managed Care, Other (non HMO) | Admitting: Family Medicine

## 2015-12-14 ENCOUNTER — Encounter: Payer: Self-pay | Admitting: Family Medicine

## 2015-12-14 ENCOUNTER — Ambulatory Visit (INDEPENDENT_AMBULATORY_CARE_PROVIDER_SITE_OTHER): Payer: Managed Care, Other (non HMO) | Admitting: Family Medicine

## 2015-12-14 DIAGNOSIS — M25511 Pain in right shoulder: Secondary | ICD-10-CM | POA: Diagnosis not present

## 2015-12-14 MED ORDER — TRAMADOL HCL 50 MG PO TABS: 50.0000 mg | ORAL_TABLET | Freq: Three times a day (TID) | ORAL | 0 refills | Status: DC | PRN

## 2015-12-14 NOTE — Assessment & Plan Note (Signed)
Established problem, worsening. No improvement with injection therapy. Long-standing history rotator cuff arthropathy. Needs to see orthopedics. Has an upcoming appointment. Treating with tramadol until orthopedic appointment. Likely needs MRI and possible surgical intervention.

## 2015-12-14 NOTE — Progress Notes (Signed)
Pre visit review using our clinic review tool, if applicable. No additional management support is needed unless otherwise documented below in the visit note. 

## 2015-12-14 NOTE — Patient Instructions (Signed)
Use the tramadol for pain.  Be sure to see the orthopedist.  Best of luck  Dr. Lacinda Axon

## 2015-12-14 NOTE — Progress Notes (Signed)
Subjective:  Patient ID: [name], male    DOB: 29-Sep-1965  Age: 50 y.o. MRN: [id number]  CC: Right shoulder pain  HPI:  50 year old male with hypertension, hyperlipidemia, DM 2 presents with worsening right shoulder pain.  This is been an ongoing issue for the patient. He states that his pain has been worsening recently. I have previously injected his right shoulder. He states that he has had worsening since the injection. He's had no improvement since that time. Pain is worse with range of motion. Pain is severe. He reports associated weakness of the shoulder. This appears to be exacerbated by his job; all of his work is overhead. No known relieving factors. No other complaints or concerns at this time.  Social Hx   Social History   Social History  . Marital status: Married    Spouse name: N/A  . Number of children: N/A  . Years of education: N/A   Social History Main Topics  . Smoking status: Passive Smoke Exposure - Never Smoker  . Smokeless tobacco: Never Used  . Alcohol use 0.0 oz/week     Comment: OCCASIONAL  . Drug use: No  . Sexual activity: Not Asked   Other Topics Concern  . None   Social History Narrative  . None    Review of Systems  Constitutional: Negative.   Musculoskeletal:       Right shoulder pain.   Objective:  BP 116/75 (BP Location: Left Arm, Patient Position: Sitting, Cuff Size: Normal)   Pulse 87   Temp 98.4 F (36.9 C) (Oral)   Resp 12   Wt 182 lb 2 oz (82.6 kg)   SpO2 97%   BMI 25.40 kg/m   BP/Weight 12/14/2015 10/23/2015 [id number]  Systolic BP 99991111 [id number] 97  Diastolic BP 75 86 71  Wt. (Lbs) 182.13 181.25 174  BMI 25.4 25.28 24.27   Physical Exam  Constitutional: He is oriented to person, place, and time. He appears well-developed. No distress.  Pulmonary/Chest: Effort normal.  Musculoskeletal:  Shoulder: Right Inspection reveals no abnormalities, atrophy or asymmetry. Palpation is normal with no tenderness over AC joint or  bicipital groove. ROM full but painful.  Rotator cuff strength - 4/5 subscapularis and supraspinatus.  + [place].   Neurological: He is alert and oriented to person, place, and time.  Psychiatric: He has a normal mood and affect.  Vitals reviewed.  Lab Results  Component Value Date   WBC 7.0 04/27/2015   HGB 13.9 04/27/2015   HCT 41.3 04/27/2015   PLT 236.0 04/27/2015   GLUCOSE 114 (H) 04/27/2015   CHOL 225 (H) 04/27/2015   TRIG 51.0 04/27/2015   HDL 57.10 04/27/2015   LDLCALC 158 (H) 04/27/2015   ALT 33 04/27/2015   AST 23 04/27/2015   NA 139 04/27/2015   K 4.3 04/27/2015   CL 103 04/27/2015   CREATININE 1.05 04/27/2015   BUN 15 04/27/2015   CO2 26 04/27/2015   PSA 0.36 04/27/2015   INR 1.8 (H) 06/30/2006   HGBA1C 6.5 04/27/2015   MICROALBUR <0.7 04/27/2015    Assessment & Plan:   Problem List Items Addressed This Visit    Right shoulder pain    Established problem, worsening. No improvement with injection therapy. Long-standing history rotator cuff arthropathy. Needs to see orthopedics. Has an upcoming appointment. Treating with tramadol until orthopedic appointment. Likely needs MRI and possible surgical intervention.          Meds ordered this encounter  Medications  .  traMADol (ULTRAM) 50 MG tablet    Sig: Take 1 tablet (50 mg total) by mouth every 8 (eight) hours as needed.    Dispense:  60 tablet    Refill:  0    Follow-up: PRN  [place]

## 2015-12-21 ENCOUNTER — Other Ambulatory Visit: Payer: Self-pay | Admitting: Family Medicine

## 2015-12-24 ENCOUNTER — Other Ambulatory Visit: Payer: Self-pay | Admitting: Physician Assistant

## 2015-12-24 DIAGNOSIS — M7541 Impingement syndrome of right shoulder: Secondary | ICD-10-CM

## 2016-01-04 ENCOUNTER — Ambulatory Visit: Payer: Managed Care, Other (non HMO)

## 2016-01-24 ENCOUNTER — Other Ambulatory Visit: Payer: Self-pay | Admitting: Family Medicine

## 2016-01-25 NOTE — Telephone Encounter (Signed)
Refilled 09/19/15. Pt last seen 12/14/15. Please advise?

## 2016-02-12 ENCOUNTER — Other Ambulatory Visit: Payer: Self-pay | Admitting: Family Medicine

## 2016-02-18 ENCOUNTER — Ambulatory Visit (INDEPENDENT_AMBULATORY_CARE_PROVIDER_SITE_OTHER): Payer: Managed Care, Other (non HMO) | Admitting: Family

## 2016-02-18 ENCOUNTER — Encounter: Payer: Self-pay | Admitting: Family

## 2016-02-18 VITALS — BP 120/80 | HR 100 | Temp 98.2°F | Resp 16 | Wt 182.0 lb

## 2016-02-18 DIAGNOSIS — J40 Bronchitis, not specified as acute or chronic: Secondary | ICD-10-CM | POA: Diagnosis not present

## 2016-02-18 MED ORDER — HYDROCODONE-HOMATROPINE 5-1.5 MG/5ML PO SYRP: 5.0000 mL | ORAL_SOLUTION | Freq: Every evening | ORAL | 0 refills | Status: DC | PRN

## 2016-02-18 NOTE — Progress Notes (Signed)
Subjective:    Patient ID: [name], male    DOB: [birth year], 51 y.o.   MRN: [id number]  CC: [name] is a 51 y.o. male who presents today for an acute visit.    HPI: CC: productive cough, sore throat x 5 days, waxing and waning. No fever, chills, sob, wheezing.  Tried mucinex didn't resolve.     No lung disease      HISTORY:  Past Medical History:  Diagnosis Date  . Chronic urethral stricture HX DILATION   . Diabetes mellitus without complication ([place])   . Headache, classical migraine   . History of pulmonary embolus (PE) POST SURG. 2008  . Hyperlipidemia   . Hypertension   . Seasonal allergies    Past Surgical History:  Procedure Laterality Date  . BALLOON DILATION  06/20/2011   Procedure: BALLOON DILATION;  Surgeon: Alexis Frock, MD;  Location: [facility];  Service: Urology;  Laterality: Left;  . CERVICAL FUSION  2004   C4 - 7  . CYSTO/ RETROGRADE URETHROGRAM/ BALLOON DILATION OF URETHRAL STRICTURE  X5 LAST ONE 12-05-2011   FEB 2011/  2009 /  2008  . CYSTOSCOPY WITH LITHOLAPAXY N/A 08/12/2012   Procedure: CYSTOSCOPY WITH LITHOLAPAXY;  Surgeon: Franchot Gallo, MD;  Location[facility];  Service: Urology;  Laterality: N/A;  . CYSTOSCOPY WITH URETHRAL DILATATION  05/16/2011   Procedure: CYSTOSCOPY WITH URETHRAL DILATATION;  Surgeon: Reece Packer, MD;  Location: [facility];  Service: Urology;  Laterality: N/A;  BALLOON DILATION AND RETROGRADE   . CYSTOSCOPY WITH URETHRAL DILATATION  12/30/2011   Procedure: CYSTOSCOPY WITH URETHRAL DILATATION;  Surgeon: Reece Packer, MD;  Location: [facility];  Service: Urology;  Laterality: N/A;  CYSTO, BALLOON DILATION, RETROGRADE URETHROGRAM  . CYSTOSCOPY WITH URETHRAL DILATATION N/A 05/06/2012   Procedure: CYSTOSCOPY WITH BALLOON DILATATION;  Surgeon: Reece Packer, MD;  Location: [facility];  Service: Urology;  Laterality: N/A;  . CYSTOSCOPY WITH URETHRAL DILATATION N/A 08/12/2012   Procedure: CYSTOSCOPY WITH URETHRAL DILATATION  CYSTOSCOPY WITH BALLOON DILATION OF URETHRAL STRICTURE;  Surgeon: Franchot Gallo, MD;  Location[facility];  Service: Urology;  Laterality: N/A;  . CYSTOSCOPY WITH URETHRAL DILATATION N/A 10/12/2012   Procedure: CYSTOSCOPY WITH URETHRAL DILATATION;  Surgeon: Alexis Frock, MD;  Location: [facility];  Service: Urology;  Laterality: N/A;  cysto, retrograde, urethrogram, balloon dilation of urethreal stricture  . CYSTOSCOPY/RETROGRADE/URETEROSCOPY/STONE EXTRACTION WITH BASKET  06/20/2011   Procedure: CYSTOSCOPY/RETROGRADE/URETEROSCOPY/STONE EXTRACTION WITH BASKET;  Surgeon: Alexis Frock, MD;  Location: [facility];  Service: Urology;  Laterality: Left;  . HYPOSPADIAS CORRECTION     Surgery for hypospadias as child  . Hypospadius repair  child  . INGUINAL HERNIA REPAIR  FEB 2011   LEFT  . ORIF ELBOW FRACTURE     x2   Family History  Problem Relation Age of Onset  . Lung cancer Mother   . COPD Mother   . Rheum arthritis Mother   . Colon cancer Neg Hx     Allergies: Patient has no known allergies. Current Outpatient Prescriptions on File Prior to Visit  Medication Sig Dispense Refill  . aspirin 81 MG tablet Take 81 mg by mouth daily.    Marland Kitchen atorvastatin (LIPITOR) 40 MG tablet Take 1 tablet (40 mg total) by mouth daily. 90 tablet 3  . lisinopril (PRINIVIL,ZESTRIL) 10 MG tablet Take 1 tablet (10 mg total) by mouth daily. 90 tablet 3  . metFORMIN (GLUCOPHAGE) 500 MG tablet  TAKE 1 TABLET BY MOUTH TWICE A DAY WITH A MEAL 180 tablet 1  . rizatriptan (MAXALT) 10 MG tablet TAKE 1 TABLET BY MOUTH AS DIRECTED. MAY REPEAT IN 2 HOURS IF NEEDED FOR MIGRAINEHEADACHES 6 tablet 3  . SUMAtriptan (IMITREX) 50 MG tablet TAKE 1 TABLET BY MOUTH EVERY 2 HOURS AS NEEDED FOR MIGRAINE AS DIRECTED. 6 tablet 2  . traMADol (ULTRAM) 50 MG tablet Take 1 tablet (50 mg total) by mouth every 8 (eight) hours as needed. 60 tablet 0   No current facility-administered  medications on file prior to visit.     Social History  Substance Use Topics  . Smoking status: Passive Smoke Exposure - Never Smoker  . Smokeless tobacco: Never Used  . Alcohol use 0.0 oz/week     Comment: OCCASIONAL    Review of Systems  Constitutional: Negative for chills and fever.  HENT: Positive for congestion and sore throat.   Eyes: Negative for visual disturbance.  Respiratory: Positive for cough. Negative for shortness of breath and wheezing.   Cardiovascular: Negative for chest pain and palpitations.  Gastrointestinal: Negative for nausea and vomiting.  Neurological: Negative for headaches.      Objective:    BP 120/80 (BP Location: Left Arm, Patient Position: Sitting, Cuff Size: Large)   Pulse 100   Temp 98.2 F (36.8 C) (Oral)   Resp 16   Wt 182 lb (82.6 kg)   SpO2 96%   BMI 25.38 kg/m    Physical Exam  Constitutional: Vital signs are normal. He appears well-developed and well-nourished.  HENT:  Head: Normocephalic and atraumatic.  Right Ear: Hearing, tympanic membrane, external ear and ear canal normal. No drainage, swelling or tenderness. Tympanic membrane is not injected, not erythematous and not bulging. No middle ear effusion. No decreased hearing is noted.  Left Ear: Hearing, tympanic membrane, external ear and ear canal normal. No drainage, swelling or tenderness. Tympanic membrane is not injected, not erythematous and not bulging.  No middle ear effusion. No decreased hearing is noted.  Nose: Nose normal. Right sinus exhibits no maxillary sinus tenderness and no frontal sinus tenderness. Left sinus exhibits no maxillary sinus tenderness and no frontal sinus tenderness.  Mouth/Throat: Uvula is midline, oropharynx is clear and moist and mucous membranes are normal. No oropharyngeal exudate, posterior oropharyngeal edema, posterior oropharyngeal erythema or tonsillar abscesses.  Eyes: Conjunctivae are normal.  Cardiovascular: Regular rhythm and normal  heart sounds.   Pulmonary/Chest: Effort normal and breath sounds normal. No respiratory distress. He has no wheezes. He has no rhonchi. He has no rales.  Lymphadenopathy:       Head (right side): No submental, no submandibular, no tonsillar, no preauricular, no posterior auricular and no occipital adenopathy present.       Head (left side): No submental, no submandibular, no tonsillar, no preauricular, no posterior auricular and no occipital adenopathy present.    He has no cervical adenopathy.  Neurological: He is alert.  Skin: Skin is warm and dry.  Psychiatric: He has a normal mood and affect. His speech is normal and behavior is normal.  Vitals reviewed.      Assessment & Plan:   1. Bronchitis Afebrile. SaO2 96%. Duration 5 days. Patient and I discussed increasing water intake with Mucinex. Discussed conservative management . Cough syrup given as needed. Negative strep.   - HYDROcodone-homatropine (HYCODAN) 5-1.5 MG/5ML syrup; Take 5 mLs by mouth at bedtime as needed for cough.  Dispense: 30 mL; Refill: 0 - POCT Rapid Strep  A    I am having Mr. Lasker maintain his atorvastatin, lisinopril, aspirin, traMADol, SUMAtriptan, rizatriptan, and metFORMIN.   No orders of the defined types were placed in this encounter.   Return precautions given.   Risks, benefits, and alternatives of the medications and treatment plan prescribed today were discussed, and patient expressed understanding.   Education regarding symptom management and diagnosis given to patient on AVS.  Continue to follow with Coral Spikes, DO for routine health maintenance.   Silvano Rusk and I agreed with plan.   Mable Paris, FNP

## 2016-02-18 NOTE — Patient Instructions (Signed)
Suspect viral illness.   Please take cough medication at night only as needed. As we discussed, I do not recommend dosing throughout the day as coughing is a protective mechanism . It also helps to break up thick mucous.  Do not take cough suppressants with alcohol as can lead to trouble breathing. Advise caution if taking cough suppressant and operating machinery ( i.e driving a car) as you may feel very tired.    Increase intake of clear fluids. Congestion is best treated by hydration, when mucus is wetter, it is thinner, less sticky, and easier to expel from the body, either through coughing up drainage, or by blowing your nose.   Get plenty of rest.   Use saline nasal drops and blow your nose frequently. Run a humidifier at night and elevate the head of the bed. Vicks Vapor rub will help with congestion and cough. Steam showers and sinus massage for congestion.   Use Acetaminophen or Ibuprofen as needed for fever or pain. Avoid second hand smoke. Even the smallest exposure will worsen symptoms.   Over the counter medications you can try include Delsym for cough, a decongestant for congestion, and Mucinex or Robitussin as an expectorant. Be sure to just get the plain Mucinex or Robitussin that just has one medication (Guaifenesen). We don't recommend the combination products. Note, be sure to drink two glasses of water with each dose of Mucinex as the medication will not work well without adequate hydration.   You can also try a teaspoon of honey to see if this will help reduce cough. Throat lozenges can sometimes be beneficial as well.    This illness will typically last 7 - 10 days.   Please follow up with our clinic if you develop a fever greater than 101 F, symptoms worsen, or do not resolve in the next week.

## 2016-03-11 ENCOUNTER — Other Ambulatory Visit: Payer: Self-pay | Admitting: Family Medicine

## 2016-03-11 ENCOUNTER — Telehealth: Payer: Self-pay | Admitting: Family Medicine

## 2016-03-11 DIAGNOSIS — J0100 Acute maxillary sinusitis, unspecified: Secondary | ICD-10-CM

## 2016-03-11 NOTE — Telephone Encounter (Signed)
Left message to call.

## 2016-03-11 NOTE — Telephone Encounter (Signed)
Refilled 12/21/15. Last OV: 12/14/15. Future OV: none.  Please advise?

## 2016-03-11 NOTE — Telephone Encounter (Signed)
Pt wife called returning your call. Thank you!   Call pt @ [phone].

## 2016-03-11 NOTE — Telephone Encounter (Signed)
Call pt-  Are these new symptoms?   I saw him almost 4 weeks ago  He would need re-evaluation if new symptoms. I would assume what he had when I saw him resolved.   Please get more info

## 2016-03-11 NOTE — Telephone Encounter (Signed)
Pt spouse called back returning your call. Thank you!  Call pt @ [phone]

## 2016-03-11 NOTE — Telephone Encounter (Signed)
Pt wife called about pt needing a antibiotic for the sinus pressure and drainage and sore throat. Wife states the mucinex and cough medication is not working. Please advise?  Pharmacy is [facility], [place]  Call pt @ [phone]. Thank you!

## 2016-03-11 NOTE — Telephone Encounter (Signed)
Pt was seen on 02/18/2016. Please advise.

## 2016-03-12 NOTE — Telephone Encounter (Signed)
Spoke with patients wife patient is having sinus drainage , sinus pain , pressure present since 02/18/16.  No fever.  Dry cough post nasal drainage.  Taking OTC Mucinex.  OTC Claritin.     Planning on going out of town on Friday.    Ok to leave wife message on cell phone Debbie [phone].

## 2016-03-12 NOTE — Telephone Encounter (Signed)
Left message to call.

## 2016-03-13 MED ORDER — AMOXICILLIN 500 MG PO CAPS: 500.0000 mg | ORAL_CAPSULE | Freq: Two times a day (BID) | ORAL | 0 refills | Status: DC

## 2016-03-13 NOTE — Telephone Encounter (Signed)
Left message for debbie to return call back.

## 2016-03-13 NOTE — Telephone Encounter (Signed)
Call wife  Happy to send in abx.  Let us know if not better  Stay on mucinex if congestion thick.

## 2016-03-14 ENCOUNTER — Encounter: Payer: Self-pay | Admitting: Family Medicine

## 2016-03-14 ENCOUNTER — Ambulatory Visit (INDEPENDENT_AMBULATORY_CARE_PROVIDER_SITE_OTHER): Payer: Managed Care, Other (non HMO) | Admitting: Family Medicine

## 2016-03-14 DIAGNOSIS — J988 Other specified respiratory disorders: Secondary | ICD-10-CM | POA: Diagnosis not present

## 2016-03-14 MED ORDER — AMOXICILLIN-POT CLAVULANATE 875-125 MG PO TABS: 1.0000 | ORAL_TABLET | Freq: Two times a day (BID) | ORAL | 0 refills | Status: DC

## 2016-03-14 NOTE — Patient Instructions (Signed)
Take the antibiotic as prescribed. ° °Feel better. ° °Dr. Orenthal Debski  °

## 2016-03-14 NOTE — Progress Notes (Signed)
Subjective:  Patient ID: [name], male    DOB: [birth year]  Age: 51 y.o. MRN: [id number]  CC: Sore throat, sinus congestion/pressure  HPI:  51 year old male presents with the above complaints.  Patient states that he has not felt well since February 12. He was seen at that time and after failing to improve was called in an antibiotic shortly after. He states that he continues to have sore throat, sinus pressure and congestion. He reports associated cough. Moderate in severity. No fever. No known exacerbating or relieving factors. No other associated symptoms. No other complaints or concerns at this time.  Social Hx   Social History   Social History  . Marital status: Married    Spouse name: N/A  . Number of children: N/A  . Years of education: N/A   Social History Main Topics  . Smoking status: Passive Smoke Exposure - Never Smoker  . Smokeless tobacco: Never Used  . Alcohol use 0.0 oz/week     Comment: OCCASIONAL  . Drug use: No  . Sexual activity: Not Asked   Other Topics Concern  . None   Social History Narrative  . None   Review of Systems  Constitutional: Negative for fever.  HENT: Positive for congestion, sinus pain, sinus pressure and sore throat.   Respiratory: Positive for cough.    Objective:  BP (!) 133/94   Pulse 88   Temp 98.1 F (36.7 C) (Oral)   Wt 183 lb 6 oz (83.2 kg)   SpO2 98%   BMI 25.58 kg/m   BP/Weight 03/14/2016 02/18/2016 23/05/5730  Systolic BP 202 542 706  Diastolic BP 94 80 75  Wt. (Lbs) 183.38 182 182.13  BMI 25.58 25.38 25.4   Physical Exam  Constitutional: He is oriented to person, place, and time. He appears well-developed. No distress.  HENT:  Head: Normocephalic and atraumatic.  Oropharynx with mild erythema. No sinus tenderness to percussion or palpation.  Eyes: Conjunctivae are normal.  Neck: Neck supple.  Cardiovascular: Normal rate and regular rhythm.   Pulmonary/Chest: Effort normal and breath sounds normal.    Lymphadenopathy:    He has no cervical adenopathy.  Neurological: He is alert and oriented to person, place, and time.  Psychiatric: He has a normal mood and affect.  Vitals reviewed.   Lab Results  Component Value Date   WBC 7.0 04/27/2015   HGB 13.9 04/27/2015   HCT 41.3 04/27/2015   PLT 236.0 04/27/2015   GLUCOSE 114 (H) 04/27/2015   CHOL 225 (H) 04/27/2015   TRIG 51.0 04/27/2015   HDL 57.10 04/27/2015   LDLCALC 158 (H) 04/27/2015   ALT 33 04/27/2015   AST 23 04/27/2015   NA 139 04/27/2015   K 4.3 04/27/2015   CL 103 04/27/2015   CREATININE 1.05 04/27/2015   BUN 15 04/27/2015   CO2 26 04/27/2015   PSA 0.36 04/27/2015   INR 1.8 (H) 06/30/2006   HGBA1C 6.5 04/27/2015   MICROALBUR <0.7 04/27/2015    Assessment & Plan:   Problem List Items Addressed This Visit    Respiratory infection    New problem. Given duration of illness and persistent symptoms, treating empirically with Augmentin.         Meds ordered this encounter  Medications  . fluticasone (FLONASE) 50 MCG/ACT nasal spray    Sig: Place into both nostrils daily.  Marland Kitchen loratadine (CLARITIN) 10 MG tablet    Sig: Take 10 mg by mouth daily.  Marland Kitchen amoxicillin-clavulanate (AUGMENTIN) 875-125  MG tablet    Sig: Take 1 tablet by mouth 2 (two) times daily.    Dispense:  20 tablet    Refill:  0    Follow-up: PRN  [place]

## 2016-03-14 NOTE — Assessment & Plan Note (Signed)
New problem. Given duration of illness and persistent symptoms, treating empirically with Augmentin.

## 2016-03-14 NOTE — Progress Notes (Signed)
Pre visit review using our clinic review tool, if applicable. No additional management support is needed unless otherwise documented below in the visit note. 

## 2016-03-17 NOTE — Telephone Encounter (Signed)
Patient is doing fine and has seen DR. Cook for FU.

## 2016-04-23 ENCOUNTER — Encounter: Payer: Self-pay | Admitting: Family Medicine

## 2016-04-23 ENCOUNTER — Ambulatory Visit (INDEPENDENT_AMBULATORY_CARE_PROVIDER_SITE_OTHER): Payer: Managed Care, Other (non HMO) | Admitting: Family Medicine

## 2016-04-23 VITALS — BP 124/80 | HR 99 | Temp 97.8°F

## 2016-04-23 DIAGNOSIS — G8929 Other chronic pain: Secondary | ICD-10-CM | POA: Diagnosis not present

## 2016-04-23 DIAGNOSIS — E119 Type 2 diabetes mellitus without complications: Secondary | ICD-10-CM | POA: Diagnosis not present

## 2016-04-23 DIAGNOSIS — M25511 Pain in right shoulder: Secondary | ICD-10-CM

## 2016-04-23 DIAGNOSIS — R300 Dysuria: Secondary | ICD-10-CM

## 2016-04-23 DIAGNOSIS — Z130 Encounter for screening for diseases of the blood and blood-forming organs and certain disorders involving the immune mechanism: Secondary | ICD-10-CM

## 2016-04-23 DIAGNOSIS — E785 Hyperlipidemia, unspecified: Secondary | ICD-10-CM | POA: Diagnosis not present

## 2016-04-23 DIAGNOSIS — I10 Essential (primary) hypertension: Secondary | ICD-10-CM

## 2016-04-23 LAB — LIPID PANEL
CHOLESTEROL: 143 mg/dL (ref 0–200)
HDL: 50.2 mg/dL (ref >39.00)
LDL CALC: 79 mg/dL (ref 0–99)
NonHDL: 92.39
Total CHOL/HDL Ratio: 3
Triglycerides: 66 mg/dL (ref 0.0–149.0)
VLDL: 13.2 mg/dL (ref 0.0–40.0)

## 2016-04-23 LAB — POCT URINALYSIS DIPSTICK
BILIRUBIN UA: NEGATIVE
Blood, UA: NEGATIVE
KETONES UA: NEGATIVE
LEUKOCYTES UA: NEGATIVE
Nitrite, UA: NEGATIVE
PH UA: 5 (ref 5.0–8.0)
Protein, UA: NEGATIVE
Spec Grav, UA: 1.01 (ref 1.010–1.025)
Urobilinogen, UA: 0.2 E.U./dL

## 2016-04-23 LAB — COMPREHENSIVE METABOLIC PANEL
ALBUMIN: 4.8 g/dL (ref 3.5–5.2)
ALK PHOS: 44 U/L (ref 39–117)
ALT: 45 U/L (ref 0–53)
AST: 28 U/L (ref 0–37)
BUN: 19 mg/dL (ref 6–23)
CHLORIDE: 99 meq/L (ref 96–112)
CO2: 24 mEq/L (ref 19–32)
CREATININE: 1.22 mg/dL (ref 0.40–1.50)
Calcium: 9.6 mg/dL (ref 8.4–10.5)
GFR: 66.28 mL/min (ref >60.00)
Glucose, Bld: 379 mg/dL — ABNORMAL HIGH (ref 70–99)
Potassium: 4.1 mEq/L (ref 3.5–5.1)
Sodium: 131 mEq/L — ABNORMAL LOW (ref 135–145)
Total Bilirubin: 0.5 mg/dL (ref 0.2–1.2)
Total Protein: 7.9 g/dL (ref 6.0–8.3)

## 2016-04-23 LAB — CBC
HEMATOCRIT: 39.9 % (ref 39.0–52.0)
HEMOGLOBIN: 13.7 g/dL (ref 13.0–17.0)
MCHC: 34.4 g/dL (ref 30.0–36.0)
MCV: 88.4 fl (ref 78.0–100.0)
PLATELETS: 238 10*3/uL (ref 150.0–400.0)
RBC: 4.52 Mil/uL (ref 4.22–5.81)
RDW: 13.6 % (ref 11.5–15.5)
WBC: 7 10*3/uL (ref 4.0–10.5)

## 2016-04-23 LAB — HEMOGLOBIN A1C: Hgb A1c MFr Bld: 8.9 % — ABNORMAL HIGH (ref 4.6–6.5)

## 2016-04-23 MED ORDER — METHYLPREDNISOLONE ACETATE 40 MG/ML IJ SUSP
40.0000 mg | Freq: Once | INTRAMUSCULAR | Status: AC
  Administered 2016-04-23: 40 mg via INTRA_ARTICULAR

## 2016-04-23 NOTE — Patient Instructions (Signed)
Ill see if I can get the MRI set up.  Follow up in 3 months  Take care  Dr. Lacinda Axon

## 2016-04-23 NOTE — Assessment & Plan Note (Addendum)
Acute on chronic. Likely rotator cuff syndrome/pathology. Injection today.  Ordering MRI.   Procedure: Subacromial bursa injection Consent signed and scanned into record. Medication:  40 mg Depo medrol + 4 mL of lidocaine w/o epi. Preparation: area cleansed with alcohol x 3.  Injection  Landmarks identified Above medication injected using a standard posterior approach.  Patient tolerated well without bleeding or paresthesias.

## 2016-04-23 NOTE — Progress Notes (Signed)
Pre visit review using our clinic review tool, if applicable. No additional management support is needed unless otherwise documented below in the visit note. 

## 2016-04-23 NOTE — Progress Notes (Signed)
Subjective:  Patient ID: TALAN GILDNER, male    DOB: [birth year]  Age: 51 y.o. MRN: [id number]  CC: Shoulder pain (right), Dysuria  HPI:  51 year old male presents with the above complaints.  Right shoulder pain  Chronic.  Worsening as of late.  Has seen Ortho (beginning of the year). Received steroid injection. MRI was ordered but was declined by insurance.  He continued to have severe pain.  Worse at night.  Decreased ROM.   Dysuria  X 2 weeks.  Associated suprapubic discomfort.  No fever, chills.  No reports of increased frequency/urgency.  Social Hx   Social History   Social History  . Marital status: Married    Spouse name: N/A  . Number of children: N/A  . Years of education: N/A   Social History Main Topics  . Smoking status: Passive Smoke Exposure - Never Smoker  . Smokeless tobacco: Never Used  . Alcohol use 0.0 oz/week     Comment: OCCASIONAL  . Drug use: No  . Sexual activity: Not Asked   Other Topics Concern  . None   Social History Narrative  . None    Review of Systems  Constitutional: Negative.   Genitourinary: Positive for dysuria.  Musculoskeletal:       Right shoulder pain.   Objective:  BP 124/80   Pulse 99   Temp 97.8 F (36.6 C) (Oral)   SpO2 96%   BP/Weight 04/23/2016 03/14/2016 0/86/5784  Systolic BP 696 295 284  Diastolic BP 80 94 80  Wt. (Lbs) - 183.38 182  BMI - 25.58 25.38    Physical Exam  Constitutional: He is oriented to person, place, and time. He appears well-developed. No distress.  Pulmonary/Chest: Effort normal.  Abdominal: He exhibits no distension.  Musculoskeletal:  Shoulder: Right  Inspection reveals no abnormalities, atrophy or asymmetry. Palpation is normal with no tenderness over AC joint or bicipital groove. ROM decreased in abduction.    Neurological: He is alert and oriented to person, place, and time.  Psychiatric: He has a normal mood and affect.  Vitals reviewed.   Assessment &  Plan:   Problem List Items Addressed This Visit    Hyperlipidemia   Relevant Orders   Lipid panel (Completed)   Essential hypertension   Dysuria    New acute issue. UA with >1000 glucose. This is the likely culprit. A1C today. No pyuria or hematuria suggestive of UTI.      Relevant Orders   POCT Urinalysis Dipstick (Completed)   DM type 2 (diabetes mellitus, type 2) (HCC)   Relevant Orders   Hemoglobin A1c (Completed)   Comprehensive metabolic panel (Completed)   Chronic right shoulder pain - Primary    Acute on chronic. Likely rotator cuff syndrome/pathology. Injection today.  Ordering MRI.   Procedure: Subacromial bursa injection Consent signed and scanned into record. Medication:  40 mg Depo medrol + 4 mL of lidocaine w/o epi. Preparation: area cleansed with alcohol x 3.  Injection  Landmarks identified Above medication injected using a standard posterior approach.  Patient tolerated well without bleeding or paresthesias.      Relevant Medications   methylPREDNISolone acetate (DEPO-MEDROL) injection 40 mg (Completed)   Other Relevant Orders   MR Shoulder Right Wo Contrast    Other Visit Diagnoses    Screening for deficiency anemia       Relevant Orders   CBC (Completed)      Meds ordered this encounter  Medications  . methylPREDNISolone acetate (  DEPO-MEDROL) injection 40 mg   Follow-up: 3 months  [place] DO [facility]

## 2016-04-23 NOTE — Assessment & Plan Note (Signed)
New acute issue. UA with >1000 glucose. This is the likely culprit. A1C today. No pyuria or hematuria suggestive of UTI.

## 2016-04-24 ENCOUNTER — Other Ambulatory Visit: Payer: Self-pay | Admitting: Family Medicine

## 2016-04-24 ENCOUNTER — Telehealth: Payer: Self-pay | Admitting: Family Medicine

## 2016-04-24 MED ORDER — LISINOPRIL 10 MG PO TABS: 10.0000 mg | ORAL_TABLET | Freq: Every day | ORAL | 3 refills | Status: DC

## 2016-04-24 MED ORDER — EMPAGLIFLOZIN 10 MG PO TABS: 10.0000 mg | ORAL_TABLET | Freq: Every day | ORAL | 1 refills | Status: DC

## 2016-04-24 MED ORDER — ATORVASTATIN CALCIUM 40 MG PO TABS: 40.0000 mg | ORAL_TABLET | Freq: Every day | ORAL | 3 refills | Status: DC

## 2016-04-24 NOTE — Telephone Encounter (Signed)
Please send in °

## 2016-04-24 NOTE — Telephone Encounter (Signed)
Pt pharmacy called about pt thought he was going to get a Rx for Jardiance? Please advise?  Call pt @ [phone]. Thank you!

## 2016-04-25 ENCOUNTER — Telehealth: Payer: Self-pay | Admitting: *Deleted

## 2016-04-25 NOTE — Telephone Encounter (Signed)
pts wife was called and stated that pt was having left sided pain. They are out of town and pt wanted to make sure that this pain was not due to elevated glucose level. PCP was consulted while pt was on phone.  He stated that this was not the cause. Pt was having urinary symptoms but urine was clean. They were told that if symptoms got worse or pain was worse pt would need to be seen by urgent care.

## 2016-04-25 NOTE — Telephone Encounter (Signed)
Patients wife has requested a call to discuss pt's blood sugar issues  Contact Debbie [phone]

## 2016-06-03 ENCOUNTER — Encounter: Payer: Self-pay | Admitting: Family Medicine

## 2016-06-03 ENCOUNTER — Ambulatory Visit (INDEPENDENT_AMBULATORY_CARE_PROVIDER_SITE_OTHER): Payer: Managed Care, Other (non HMO) | Admitting: Family Medicine

## 2016-06-03 VITALS — BP 100/78 | HR 85 | Temp 97.9°F | Resp 16 | Wt 174.1 lb

## 2016-06-03 DIAGNOSIS — R300 Dysuria: Secondary | ICD-10-CM

## 2016-06-03 DIAGNOSIS — B372 Candidiasis of skin and nail: Secondary | ICD-10-CM | POA: Diagnosis not present

## 2016-06-03 LAB — POC URINALSYSI DIPSTICK (AUTOMATED)
Bilirubin, UA: NEGATIVE
GLUCOSE UA: 500
Ketones, UA: NEGATIVE
Leukocytes, UA: NEGATIVE
NITRITE UA: NEGATIVE
PH UA: 5 (ref 5.0–8.0)
PROTEIN UA: NEGATIVE
Spec Grav, UA: 1.015 (ref 1.010–1.025)
UROBILINOGEN UA: 0.2 U/dL

## 2016-06-03 MED ORDER — FLUCONAZOLE 150 MG PO TABS: 150.0000 mg | ORAL_TABLET | Freq: Once | ORAL | 0 refills | Status: AC

## 2016-06-03 NOTE — Patient Instructions (Addendum)
Stop the Jardiance.  Increase the metformin to 1000 mg twice daily.  Schedule a follow up with your urologist.  Take the diflucan as prescribed.  Follow up in July (after the 18th)  Take care  Dr. Lacinda Axon

## 2016-06-03 NOTE — Assessment & Plan Note (Signed)
New prompt. Patient symptoms are coming from irritation and yeast infection secondary to Jardiance. Stopping Jardiance. Diflucan for the infection. No evidence of UTI based on UA.

## 2016-06-03 NOTE — Progress Notes (Signed)
Subjective:  Patient ID: [name], male    DOB: Apr 21, 1965  Age: 51 y.o. MRN: [id number]  CC: Dysuria, frequency  HPI:  51 year old male with DM 2, urethral stricture and hypospadias, HTN, HLD presents with the above complaints.  Patient reports dysuria and frequency since Thursday. No back pain/flank pain. No hematuria. He reports some redness and irritation around his urethral opening and around the penis and scrotum. He reports that it's irritated and itchy. No associated fever. No other associated symptoms. No other complaints or concerns at this time.  Social Hx   Social History   Social History  . Marital status: Married    Spouse name: N/A  . Number of children: N/A  . Years of education: N/A   Social History Main Topics  . Smoking status: Passive Smoke Exposure - Never Smoker  . Smokeless tobacco: Never Used  . Alcohol use 0.0 oz/week     Comment: OCCASIONAL  . Drug use: No  . Sexual activity: Not Asked   Other Topics Concern  . None   Social History Narrative  . None    Review of Systems  Constitutional: Negative.   Genitourinary: Positive for dysuria and frequency. Negative for flank pain.  Musculoskeletal: Negative for back pain.   Objective:  BP 100/78 (BP Location: Left Arm, Patient Position: Sitting, Cuff Size: Normal)   Pulse 85   Temp 97.9 F (36.6 C) (Oral)   Resp 16   Wt 174 lb 2 oz (79 kg)   SpO2 96%   BMI 24.29 kg/m   BP/Weight 06/03/2016 1/32/4401 0/02/7251  Systolic BP 664 403 474  Diastolic BP 78 80 94  Wt. (Lbs) 174.13 - 183.38  BMI 24.29 - 25.58   Physical Exam  Constitutional: He is oriented to person, place, and time. He appears well-developed. No distress.  Cardiovascular: Normal rate and regular rhythm.   Pulmonary/Chest: Effort normal and breath sounds normal. He has no wheezes. He has no rales.  Neurological: He is alert and oriented to person, place, and time.  Psychiatric: He has a normal mood and affect.  Vitals  reviewed.  Lab Results  Component Value Date   WBC 7.0 04/23/2016   HGB 13.7 04/23/2016   HCT 39.9 04/23/2016   PLT 238.0 04/23/2016   GLUCOSE 379 (H) 04/23/2016   CHOL 143 04/23/2016   TRIG 66.0 04/23/2016   HDL 50.20 04/23/2016   LDLCALC 79 04/23/2016   ALT 45 04/23/2016   AST 28 04/23/2016   NA 131 (L) 04/23/2016   K 4.1 04/23/2016   CL 99 04/23/2016   CREATININE 1.22 04/23/2016   BUN 19 04/23/2016   CO2 24 04/23/2016   PSA 0.36 04/27/2015   INR 1.8 (H) 06/30/2006   HGBA1C 8.9 (H) 04/23/2016   MICROALBUR <0.7 04/27/2015    Assessment & Plan:   Problem List Items Addressed This Visit    Candidal skin infection    New prompt. Patient symptoms are coming from irritation and yeast infection secondary to Jardiance. Stopping Jardiance. Diflucan for the infection. No evidence of UTI based on UA.      Relevant Medications   fluconazole (DIFLUCAN) 150 MG tablet   Other Relevant Orders   POCT Urinalysis Dipstick (Automated) (Completed)    Other Visit Diagnoses    Dysuria    -  Primary   Relevant Orders   Urine culture     Meds ordered this encounter  Medications  . fluconazole (DIFLUCAN) 150 MG tablet  Sig: Take 1 tablet (150 mg total) by mouth once.    Dispense:  1 tablet    Refill:  0   Follow-up: PRN  [place]

## 2016-06-04 LAB — URINE CULTURE

## 2016-06-27 ENCOUNTER — Telehealth: Payer: Self-pay | Admitting: *Deleted

## 2016-06-27 NOTE — Telephone Encounter (Signed)
He really needs to see urology. He has an extensive urological history and all of his cultures have been negative. This is not coming from UTI.

## 2016-06-27 NOTE — Telephone Encounter (Signed)
Patient is currently having burning during urination and frequency in feeling as if he has to urinate. Pt has a Hx of UTI's and requested a script for Cipro Pt contact [place]

## 2016-06-27 NOTE — Telephone Encounter (Signed)
Dr. Lacinda Axon what would you like me to do, if anything, thanks

## 2016-06-27 NOTE — Telephone Encounter (Signed)
Patients wife and patient have been very rude and very making very disrespectful remarks about Dr. Lacinda Axon. Patient was irate and has hung up after he said his remarks.  Both were informed of what was directed. Please advise.

## 2016-06-27 NOTE — Telephone Encounter (Signed)
I have done several urine specimens all which were negative. I am not pushing him to anyone else, I just don't have anything to offer him in the setting of repeated negative evaluations and his urological history.

## 2016-06-27 NOTE — Telephone Encounter (Signed)
Patient stated urology did nothing to help him? They gave him the same medication you gave him. The rash is gone but he still has the dysuria and frequency.  Patient is getting very irrate because he is being "pushed on to someone else". Please advise.

## 2016-06-27 NOTE — Telephone Encounter (Signed)
Patient is still having burning during urination and frequency in feeling as if he has to urinate.  SX have been going on since ,last appointment with Dr.Cook. Pt has a Hx of UTI's and requested a script for Cipro.  Please advise.

## 2016-06-27 NOTE — Telephone Encounter (Signed)
Please call and triage patient, thanks

## 2016-06-30 NOTE — Telephone Encounter (Signed)
This behavior is not acceptable.  He will need to find another provider if he and his wife are going to be disrespectful.

## 2016-07-01 ENCOUNTER — Telehealth: Payer: Self-pay | Admitting: Family Medicine

## 2016-07-01 NOTE — Telephone Encounter (Signed)
Patient dismissed from [facility] by Thersa Salt DO , effective July 01, 2016. Dismissal letter sent out by certified / registered mail.  daj

## 2016-07-01 NOTE — Telephone Encounter (Signed)
In further discussion with [name], patient stated he was not coming back to see Dr. Lacinda Axon.  Also patient is not following the heath care plan clearly outlined by Dr. Lacinda Axon and is being disrespectful to staff and PCP.  Discussed with Dr. Lacinda Axon.  Please start dismissal process.

## 2016-07-01 NOTE — Telephone Encounter (Signed)
Sent forms to HIM for dismissal, thanks

## 2016-07-17 NOTE — Telephone Encounter (Signed)
Received signed domestic return receipt verifying delivery of certified mail on 4/43/1540 article number [id number]  [facility]WR.

## 2016-12-12 DIAGNOSIS — G43009 Migraine without aura, not intractable, without status migrainosus: Secondary | ICD-10-CM | POA: Insufficient documentation

## 2018-06-03 ENCOUNTER — Other Ambulatory Visit: Payer: Self-pay

## 2018-06-03 ENCOUNTER — Ambulatory Visit: Payer: Managed Care, Other (non HMO)

## 2018-06-03 ENCOUNTER — Other Ambulatory Visit: Payer: Self-pay | Admitting: Internal Medicine

## 2018-06-03 ENCOUNTER — Other Ambulatory Visit (HOSPITAL_COMMUNITY): Payer: Self-pay | Admitting: Internal Medicine

## 2018-06-03 ENCOUNTER — Ambulatory Visit
Admission: RE | Admit: 2018-06-03 | Discharge: 2018-06-03 | Disposition: A | Discharge status: Home / Self Care | Payer: Managed Care, Other (non HMO) | Source: Ambulatory Visit | Attending: Internal Medicine | Admitting: Internal Medicine

## 2018-06-03 DIAGNOSIS — M79605 Pain in left leg: Secondary | ICD-10-CM | POA: Insufficient documentation

## 2019-01-12 ENCOUNTER — Other Ambulatory Visit: Payer: Self-pay

## 2019-01-12 ENCOUNTER — Ambulatory Visit
Admission: EM | Admit: 2019-01-12 | Discharge: 2019-01-12 | Disposition: A | Discharge status: Home / Self Care | Payer: Managed Care, Other (non HMO) | Attending: Emergency Medicine | Admitting: Emergency Medicine

## 2019-01-12 DIAGNOSIS — R430 Anosmia: Secondary | ICD-10-CM

## 2019-01-12 DIAGNOSIS — U071 COVID-19: Secondary | ICD-10-CM | POA: Diagnosis not present

## 2019-01-12 DIAGNOSIS — R0981 Nasal congestion: Secondary | ICD-10-CM | POA: Diagnosis not present

## 2019-01-12 DIAGNOSIS — R6883 Chills (without fever): Secondary | ICD-10-CM | POA: Diagnosis not present

## 2019-01-12 DIAGNOSIS — Z0189 Encounter for other specified special examinations: Secondary | ICD-10-CM

## 2019-01-12 DIAGNOSIS — Z20822 Contact with and (suspected) exposure to covid-19: Secondary | ICD-10-CM

## 2019-01-12 LAB — POC SARS CORONAVIRUS 2 AG -  ED: SARS Coronavirus 2 Ag: POSITIVE — AB

## 2019-01-12 NOTE — ED Provider Notes (Signed)
[name]    CSN: [id number] Arrival date & time: 01/12/19  1315      History   Chief Complaint Chief Complaint  Patient presents with  . Covid Exposure    HPI [name] is a 54 y.o. male.   Patient presents with request for a COVID test.  He states he was exposed to a positive family member.  He reports nasal congestion, sinus pressure, loss of smell, low grade fever, and chills x 5 days ago; Tmax 99.5.  He denies rash, cough, shortness of breath, vomiting, diarrhea, or other symptoms.  No treatments attempted at home.     The history is provided by the patient.    Past Medical History:  Diagnosis Date  . Chronic urethral stricture HX DILATION   . Diabetes mellitus without complication ([place])   . Headache, classical migraine   . History of pulmonary embolus (PE) POST SURG. 2008  . Hyperlipidemia   . Hypertension   . Seasonal allergies     Patient Active Problem List   Diagnosis Date Noted  . Candidal skin infection 06/03/2016  . Chronic right shoulder pain 04/23/2016  . Left shoulder pain 06/18/2015  . Essential hypertension 04/29/2015  . Migraine 04/29/2015  . Hyperlipidemia 04/29/2015  . DM type 2 (diabetes mellitus, type 2) ([place]) 04/29/2015  . Trigger thumb 04/29/2015  . Hypospadias 01/14/2013  . [place] 01/14/2013  . Chronic urethral stricture 12/05/2010    Past Surgical History:  Procedure Laterality Date  . BALLOON DILATION  06/20/2011   Procedure: BALLOON DILATION;  Surgeon: Alexis Frock, MD;  Location: [facility];  Service: Urology;  Laterality: Left;  . CERVICAL FUSION  2004   C4 - 7  . CYSTO/ RETROGRADE URETHROGRAM/ BALLOON DILATION OF URETHRAL STRICTURE  X5 LAST ONE 12-05-2011   FEB 2011/  2009 /  2008  . CYSTOSCOPY WITH LITHOLAPAXY N/A 08/12/2012   Procedure: CYSTOSCOPY WITH LITHOLAPAXY;  Surgeon: Franchot Gallo, MD;  Location: [facility];  Service: Urology;  Laterality: N/A;  . CYSTOSCOPY WITH URETHRAL DILATATION   05/16/2011   Procedure: CYSTOSCOPY WITH URETHRAL DILATATION;  Surgeon: Reece Packer, MD;  Location: [facility];  Service: Urology;  Laterality: N/A;  BALLOON DILATION AND RETROGRADE   . CYSTOSCOPY WITH URETHRAL DILATATION  12/30/2011   Procedure: CYSTOSCOPY WITH URETHRAL DILATATION;  Surgeon: Reece Packer, MD;  Location: [facility];  Service: Urology;  Laterality: N/A;  CYSTO, BALLOON DILATION, RETROGRADE URETHROGRAM  . CYSTOSCOPY WITH URETHRAL DILATATION N/A 05/06/2012   Procedure: CYSTOSCOPY WITH BALLOON DILATATION;  Surgeon: Reece Packer, MD;  Location: [facility];  Service: Urology;  Laterality: N/A;  . CYSTOSCOPY WITH URETHRAL DILATATION N/A 08/12/2012   Procedure: CYSTOSCOPY WITH URETHRAL DILATATION  CYSTOSCOPY WITH BALLOON DILATION OF URETHRAL STRICTURE;  Surgeon: Franchot Gallo, MD;  Location[facility];  Service: Urology;  Laterality: N/A;  . CYSTOSCOPY WITH URETHRAL DILATATION N/A 10/12/2012   Procedure: CYSTOSCOPY WITH URETHRAL DILATATION;  Surgeon: Alexis Frock, MD;  Location: [facility];  Service: Urology;  Laterality: N/A;  cysto, retrograde, urethrogram, balloon dilation of urethreal stricture  . CYSTOSCOPY/RETROGRADE/URETEROSCOPY/STONE EXTRACTION WITH BASKET  06/20/2011   Procedure: CYSTOSCOPY/RETROGRADE/URETEROSCOPY/STONE EXTRACTION WITH BASKET;  Surgeon: Alexis Frock, MD;  Location: [facility];  Service: Urology;  Laterality: Left;  . HYPOSPADIAS CORRECTION     Surgery for hypospadias as child  . Hypospadius repair  child  . INGUINAL HERNIA REPAIR  FEB 2011   LEFT  . ORIF ELBOW FRACTURE  x2       Home Medications    Prior to Admission medications   Medication Sig Start Date End Date Taking? Authorizing Provider  aspirin 81 MG tablet Take 81 mg by mouth daily.    [provider]  atorvastatin (LIPITOR) 40 MG tablet Take 1 tablet (40 mg total) by mouth daily. 04/24/16   Coral Spikes, DO  fluticasone (FLONASE) 50 MCG/ACT  nasal spray Place into both nostrils daily.    [provider]  lisinopril (PRINIVIL,ZESTRIL) 10 MG tablet Take 1 tablet (10 mg total) by mouth daily. 04/24/16   Coral Spikes, DO  loratadine (CLARITIN) 10 MG tablet Take 10 mg by mouth daily.    [provider]  metFORMIN (GLUCOPHAGE) 500 MG tablet TAKE 1 TABLET BY MOUTH TWICE A DAY WITH A MEAL 02/12/16   Cook, Jayce G, DO  rizatriptan (MAXALT) 10 MG tablet TAKE 1 TABLET BY MOUTH AS DIRECTED. MAY REPEAT IN 2 HOURS IF NEEDED FOR MIGRAINEHEADACHES 01/25/16   Cook, Michael Boston G, DO  SUMAtriptan (IMITREX) 50 MG tablet TAKE 1 TABLET BY MOUTH EVERY 2 HOURS AS NEEDED FOR MIGRAINE AS DIRECTED 03/11/16   Coral Spikes, DO  traMADol (ULTRAM) 50 MG tablet Take 1 tablet (50 mg total) by mouth every 8 (eight) hours as needed. 12/14/15   Coral Spikes, DO    Family History Family History  Problem Relation Age of Onset  . Lung cancer Mother   . COPD Mother   . Rheum arthritis Mother   . Colon cancer Neg Hx     Social History Social History   Tobacco Use  . Smoking status: Passive Smoke Exposure - Never Smoker  . Smokeless tobacco: Never Used  Substance Use Topics  . Alcohol use: Yes    Alcohol/week: 0.0 standard drinks    Comment: OCCASIONAL  . Drug use: No     Allergies   Patient has no known allergies.   Review of Systems Review of Systems  Constitutional: Positive for chills and fever.  HENT: Positive for congestion and sinus pressure. Negative for ear pain and sore throat.   Eyes: Negative for pain and visual disturbance.  Respiratory: Negative for cough and shortness of breath.   Cardiovascular: Negative for chest pain and palpitations.  Gastrointestinal: Negative for abdominal pain and vomiting.  Genitourinary: Negative for dysuria and hematuria.  Musculoskeletal: Negative for arthralgias and back pain.  Skin: Negative for color change and rash.  Neurological: Negative for seizures and syncope.  All other systems reviewed  and are negative.    Physical Exam Triage Vital Signs ED Triage Vitals  Enc Vitals Group     BP 01/12/19 1328 135/88     Pulse Rate 01/12/19 1328 93     Resp 01/12/19 1328 17     Temp 01/12/19 1328 98.9 F (37.2 C)     Temp Source 01/12/19 1328 Oral     SpO2 01/12/19 1328 97 %     Weight --      Height --      Head Circumference --      Peak Flow --      Pain Score 01/12/19 1329 0     Pain Loc --      Pain Edu? --      Excl. in [place]? --    No data found.  Updated Vital Signs BP 135/88 (BP Location: Left Arm)   Pulse 93   Temp 98.9 F (37.2 C) (Oral)  Resp 17   SpO2 97%   Visual Acuity Right Eye Distance:   Left Eye Distance:   Bilateral Distance:    Right Eye Near:   Left Eye Near:    Bilateral Near:     Physical Exam Vitals and nursing note reviewed.  Constitutional:      General: He is not in acute distress.    Appearance: He is well-developed. He is not ill-appearing.  HENT:     Head: Normocephalic and atraumatic.     Right Ear: Tympanic membrane normal.     Left Ear: Tympanic membrane normal.     Nose: Congestion and rhinorrhea present.     Mouth/Throat:     Mouth: Mucous membranes are moist.     Pharynx: Oropharynx is clear.  Eyes:     Conjunctiva/sclera: Conjunctivae normal.  Cardiovascular:     Rate and Rhythm: Normal rate and regular rhythm.     Heart sounds: No murmur.  Pulmonary:     Effort: Pulmonary effort is normal. No respiratory distress.     Breath sounds: Normal breath sounds.  Abdominal:     General: Bowel sounds are normal.     Palpations: Abdomen is soft.     Tenderness: There is no abdominal tenderness. There is no guarding or rebound.  Musculoskeletal:     Cervical back: Neck supple.  Skin:    General: Skin is warm and dry.     Findings: No rash.  Neurological:     General: No focal deficit present.     Mental Status: He is alert and oriented to person, place, and time.  Psychiatric:        Mood and Affect: Mood normal.         Behavior: Behavior normal.      UC Treatments / Results  Labs (all labs ordered are listed, but only abnormal results are displayed) Labs Reviewed  POC SARS CORONAVIRUS 2 AG -  ED - Abnormal; Notable for the following components:      Result Value   SARS Coronavirus 2 Ag Positive (*)    All other components within normal limits  NOVEL CORONAVIRUS, NAA    EKG   Radiology No results found.  Procedures Procedures (including critical care time)  Medications Ordered in UC Medications - No data to display  Initial Impression / Assessment and Plan / UC Course  I have reviewed the triage vital signs and the nursing notes.  Pertinent labs & imaging results that were available during my care of the patient were reviewed by me and considered in my medical decision making (see chart for details).   COVID-19.  POC COVID test positive.  Instructed patient to self quarantine per CDC guidelines.  Instructed him to take Tylenol as needed for fever or discomfort.  Instructed him to go to the emergency department if he has a high fever not relieved by Tylenol, shortness of breath, severe diarrhea, or other concerning symptoms.  Patient agrees to plan of care.     Final Clinical Impressions(s) / UC Diagnoses   Final diagnoses:  [id number]     Discharge Instructions     Your COVID test is positive.    You should self-quarantine for:  *10 days since your symptoms first appeared and  *24 hours with no fever, without the use of fever-reducing medications and  *your other symptoms of COVID are improving.  Most people do not need to be re-tested at the end of the quarantine period.  Go to the emergency department if you have high fever not relieved by Tylenol, shortness of breath, severe diarrhea, or other concerning symptoms.       ED Prescriptions    None     PDMP not reviewed this encounter.   Sharion Balloon, NP 01/12/19 1454

## 2019-01-12 NOTE — ED Triage Notes (Signed)
Pt presents for covid testing after a covid exposure about 5 days ago.

## 2019-01-12 NOTE — Discharge Instructions (Addendum)
Your COVID test is positive.    You should self-quarantine for:  *10 days since your symptoms first appeared and  *24 hours with no fever, without the use of fever-reducing medications and  *your other symptoms of COVID are improving.  Most people do not need to be re-tested at the end of the quarantine period.    Go to the emergency department if you have high fever not relieved by Tylenol, shortness of breath, severe diarrhea, or other concerning symptoms.    

## 2019-12-24 ENCOUNTER — Emergency Department
Admission: EM | Admit: 2019-12-24 | Discharge: 2019-12-24 | Disposition: A | Discharge status: Home / Self Care | Payer: Managed Care, Other (non HMO) | Attending: Emergency Medicine | Admitting: Emergency Medicine

## 2019-12-24 ENCOUNTER — Emergency Department: Payer: Managed Care, Other (non HMO)

## 2019-12-24 DIAGNOSIS — N410 Acute prostatitis: Secondary | ICD-10-CM | POA: Insufficient documentation

## 2019-12-24 DIAGNOSIS — Z7984 Long term (current) use of oral hypoglycemic drugs: Secondary | ICD-10-CM | POA: Diagnosis not present

## 2019-12-24 DIAGNOSIS — E119 Type 2 diabetes mellitus without complications: Secondary | ICD-10-CM | POA: Diagnosis not present

## 2019-12-24 DIAGNOSIS — I10 Essential (primary) hypertension: Secondary | ICD-10-CM | POA: Insufficient documentation

## 2019-12-24 DIAGNOSIS — Z7982 Long term (current) use of aspirin: Secondary | ICD-10-CM | POA: Diagnosis not present

## 2019-12-24 DIAGNOSIS — Z79899 Other long term (current) drug therapy: Secondary | ICD-10-CM | POA: Diagnosis not present

## 2019-12-24 DIAGNOSIS — Z7722 Contact with and (suspected) exposure to environmental tobacco smoke (acute) (chronic): Secondary | ICD-10-CM | POA: Insufficient documentation

## 2019-12-24 DIAGNOSIS — R350 Frequency of micturition: Secondary | ICD-10-CM | POA: Diagnosis present

## 2019-12-24 LAB — URINALYSIS, ROUTINE W REFLEX MICROSCOPIC
Bilirubin Urine: NEGATIVE
Glucose, UA: NEGATIVE mg/dL
Ketones, ur: 20 mg/dL — AB
Nitrite: POSITIVE — AB
Protein, ur: 100 mg/dL — AB
RBC / HPF: >50 RBC/hpf — ABNORMAL HIGH (ref 0–5)
Specific Gravity, Urine: 1.019 (ref 1.005–1.030)
Squamous Epithelial / HPF: NONE SEEN (ref 0–5)
WBC, UA: >50 WBC/hpf — ABNORMAL HIGH (ref 0–5)
pH: 6 (ref 5.0–8.0)

## 2019-12-24 MED ORDER — SULFAMETHOXAZOLE-TRIMETHOPRIM 800-160 MG PO TABS: 1.0000 | ORAL_TABLET | Freq: Two times a day (BID) | ORAL | 0 refills | Status: DC

## 2019-12-24 MED ORDER — CEFTRIAXONE SODIUM 1 G IJ SOLR
1.0000 g | Freq: Once | INTRAMUSCULAR | Status: AC
  Administered 2019-12-24: 1 g via INTRAMUSCULAR
  Filled 2019-12-24: qty 10

## 2019-12-24 MED ORDER — ACETAMINOPHEN 325 MG PO TABS
650.0000 mg | ORAL_TABLET | Freq: Once | ORAL | Status: AC
  Administered 2019-12-24: 650 mg via ORAL
  Filled 2019-12-24: qty 2

## 2019-12-24 MED ORDER — SULFAMETHOXAZOLE-TRIMETHOPRIM 800-160 MG PO TABS
1.0000 | ORAL_TABLET | Freq: Once | ORAL | Status: AC
  Administered 2019-12-24: 1 via ORAL
  Filled 2019-12-24: qty 1

## 2019-12-24 NOTE — ED Provider Notes (Signed)
[facility] Emergency Department Provider Note  ____________________________________________  Time seen: Approximately 9:53 PM  I have reviewed the triage vital signs and the nursing notes.   HISTORY  Chief Complaint Dysuria    HPI [name] is a 54 y.o. male that presents to the  emergency department for dysuria and frequency of urination for 1 day.  Patient states that he was up multiple times in the middle of the night urinating.  Patient states that he has a history of multiple urinary tract infections and this feels the same.  He also feels some pressure to his rectum.  He is unaware of any fever.  He does see urology in [place].  He has had kidney stones in the past.  No nausea, vomiting, abdominal pain.  Past Medical History:  Diagnosis Date  . Chronic urethral stricture HX DILATION   . Diabetes mellitus without complication ([place])   . Headache, classical migraine   . History of pulmonary embolus (PE) POST SURG. 2008  . Hyperlipidemia   . Hypertension   . Seasonal allergies     Patient Active Problem List   Diagnosis Date Noted  . Candidal skin infection 06/03/2016  . Chronic right shoulder pain 04/23/2016  . Left shoulder pain 06/18/2015  . Essential hypertension 04/29/2015  . Migraine 04/29/2015  . Hyperlipidemia 04/29/2015  . DM type 2 (diabetes mellitus, type 2) ([place]) 04/29/2015  . Trigger thumb 04/29/2015  . Hypospadias 01/14/2013  . [place] 01/14/2013  . Chronic urethral stricture 12/05/2010    Past Surgical History:  Procedure Laterality Date  . BALLOON DILATION  06/20/2011   Procedure: BALLOON DILATION;  Surgeon: Alexis Frock, MD;  Location: [facility];  Service: Urology;  Laterality: Left;  . CERVICAL FUSION  2004   C4 - 7  . CYSTO/ RETROGRADE URETHROGRAM/ BALLOON DILATION OF URETHRAL STRICTURE  X5 LAST ONE 12-05-2011   FEB 2011/  2009 /  2008  . CYSTOSCOPY WITH LITHOLAPAXY N/A 08/12/2012   Procedure: CYSTOSCOPY WITH  LITHOLAPAXY;  Surgeon: Franchot Gallo, MD;  Location: [facility];  Service: Urology;  Laterality: N/A;  . CYSTOSCOPY WITH URETHRAL DILATATION  05/16/2011   Procedure: CYSTOSCOPY WITH URETHRAL DILATATION;  Surgeon: Reece Packer, MD;  Location: [facility];  Service: Urology;  Laterality: N/A;  BALLOON DILATION AND RETROGRADE   . CYSTOSCOPY WITH URETHRAL DILATATION  12/30/2011   Procedure: CYSTOSCOPY WITH URETHRAL DILATATION;  Surgeon: Reece Packer, MD;  [facility];  Service: Urology;  Laterality: N/A;  CYSTO, BALLOON DILATION, RETROGRADE URETHROGRAM  . CYSTOSCOPY WITH URETHRAL DILATATION N/A 05/06/2012   Procedure: CYSTOSCOPY WITH BALLOON DILATATION;  Surgeon: Reece Packer, MD;  [facility];  Service: Urology;  Laterality: N/A;  . CYSTOSCOPY WITH URETHRAL DILATATION N/A 08/12/2012   Procedure: CYSTOSCOPY WITH URETHRAL DILATATION  CYSTOSCOPY WITH BALLOON DILATION OF URETHRAL STRICTURE;  Surgeon: Franchot Gallo, MD;  Location: [facility];  Service: Urology;  Laterality: N/A;  . CYSTOSCOPY WITH URETHRAL DILATATION N/A 10/12/2012   Procedure: CYSTOSCOPY WITH URETHRAL DILATATION;  Surgeon: Alexis Frock, MD;  Location[facility];  Service: Urology;  Laterality: N/A;  cysto, retrograde, urethrogram, balloon dilation of urethreal stricture  . CYSTOSCOPY/RETROGRADE/URETEROSCOPY/STONE EXTRACTION WITH BASKET  06/20/2011   Procedure: CYSTOSCOPY/RETROGRADE/URETEROSCOPY/STONE EXTRACTION WITH BASKET;  Surgeon: Alexis Frock, MD;  [facility];  Service: Urology;  Laterality: Left;  . HYPOSPADIAS CORRECTION     Surgery for hypospadias as child  . Hypospadius repair  child  .  INGUINAL HERNIA REPAIR  FEB 2011   LEFT  . ORIF ELBOW FRACTURE     x2    Prior to Admission medications   Medication Sig Start Date End Date Taking? Authorizing Provider  aspirin 81 MG tablet Take 81 mg by mouth daily.    [provider]  atorvastatin  (LIPITOR) 40 MG tablet Take 1 tablet (40 mg total) by mouth daily. 04/24/16   Coral Spikes, DO  fluticasone (FLONASE) 50 MCG/ACT nasal spray Place into both nostrils daily.    [provider]  lisinopril (PRINIVIL,ZESTRIL) 10 MG tablet Take 1 tablet (10 mg total) by mouth daily. 04/24/16   Coral Spikes, DO  loratadine (CLARITIN) 10 MG tablet Take 10 mg by mouth daily.    [provider]  metFORMIN (GLUCOPHAGE) 500 MG tablet TAKE 1 TABLET BY MOUTH TWICE A DAY WITH A MEAL 02/12/16   Cook, Jayce G, DO  rizatriptan (MAXALT) 10 MG tablet TAKE 1 TABLET BY MOUTH AS DIRECTED. MAY REPEAT IN 2 HOURS IF NEEDED FOR MIGRAINEHEADACHES 01/25/16   Thersa Salt G, DO  sulfamethoxazole-trimethoprim (BACTRIM DS) 800-160 MG tablet Take 1 tablet by mouth 2 (two) times daily. 12/24/19   Laban Emperor, PA-C  SUMAtriptan (IMITREX) 50 MG tablet TAKE 1 TABLET BY MOUTH EVERY 2 HOURS AS NEEDED FOR MIGRAINE AS DIRECTED 03/11/16   Coral Spikes, DO  traMADol (ULTRAM) 50 MG tablet Take 1 tablet (50 mg total) by mouth every 8 (eight) hours as needed. 12/14/15   Coral Spikes, DO    Allergies Patient has no known allergies.  Family History  Problem Relation Age of Onset  . Lung cancer Mother   . COPD Mother   . Rheum arthritis Mother   . Colon cancer Neg Hx     Social History Social History   Tobacco Use  . Smoking status: Passive Smoke Exposure - Never Smoker  . Smokeless tobacco: Never Used  Substance Use Topics  . Alcohol use: Yes    Alcohol/week: 0.0 standard drinks    Comment: OCCASIONAL  . Drug use: No     Review of Systems  Constitutional: No fever/chills Cardiovascular: No chest pain. Respiratory:  No SOB. Gastrointestinal: No abdominal pain.  No nausea, no vomiting.  Genitourinary: Positive for dysuria. Musculoskeletal: Negative for musculoskeletal pain. Skin: Negative for rash, abrasions, lacerations, ecchymosis. Neurological: Negative for  headaches   ____________________________________________   PHYSICAL EXAM:  VITAL SIGNS: ED Triage Vitals  Enc Vitals Group     BP 12/24/19 2011 (!) 141/87     Pulse Rate 12/24/19 2011 (!) 110     Resp 12/24/19 2011 18     Temp 12/24/19 2011 98.9 F (37.2 C)     Temp src --      SpO2 12/24/19 2011 94 %     Weight 12/24/19 2009 170 lb (77.1 kg)     Height 12/24/19 2009 5\' 11"  (1.803 m)     Head Circumference --      Peak Flow --      Pain Score 12/24/19 2009 7     Pain Loc --      Pain Edu? --      Excl. in [place]? --      Constitutional: Alert and oriented. Well appearing and in no acute distress. Eyes: Conjunctivae are normal. PERRL. EOMI. Head: Atraumatic. ENT:      Ears:      Nose: No congestion/rhinnorhea.      Mouth/Throat: Mucous membranes are  moist.  Neck: No stridor.  Cardiovascular: Normal rate, regular rhythm.  Good peripheral circulation. Respiratory: Normal respiratory effort without tachypnea or retractions. Lungs CTAB. Good air entry to the bases with no decreased or absent breath sounds. Gastrointestinal: Bowel sounds 4 quadrants. Soft and nontender to palpation. No guarding or rigidity. No palpable masses. No distention. No CVA tenderness. Rectal: Refused Musculoskeletal: Full range of motion to all extremities. No gross deformities appreciated. Neurologic:  Normal speech and language. No gross focal neurologic deficits are appreciated.  Skin:  Skin is warm, dry and intact. No rash noted. Psychiatric: Mood and affect are normal. Speech and behavior are normal. Patient exhibits appropriate insight and judgement.   ____________________________________________   LABS (all labs ordered are listed, but only abnormal results are displayed)  Labs Reviewed  URINALYSIS, ROUTINE W REFLEX MICROSCOPIC - Abnormal; Notable for the following components:      Result Value   Color, Urine AMBER (*)    APPearance CLOUDY (*)    Hgb urine dipstick MODERATE (*)     Ketones, ur 20 (*)    Protein, ur 100 (*)    Nitrite POSITIVE (*)    Leukocytes,Ua MODERATE (*)    RBC / HPF >50 (*)    WBC, UA >50 (*)    Bacteria, UA MANY (*)    All other components within normal limits  CHLAMYDIA/NGC RT PCR ([facility] ONLY)  URINE CULTURE   ____________________________________________  EKG   ____________________________________________  RADIOLOGY Robinette Haines, personally viewed and evaluated these images (plain radiographs) as part of my medical decision making, as well as reviewing the written report by the radiologist.  CT Renal Stone Study  Result Date: 12/24/2019 CLINICAL DATA:  Urinary frequency, dysuria, hematuria EXAM: CT ABDOMEN AND PELVIS WITHOUT CONTRAST TECHNIQUE: Multidetector CT imaging of the abdomen and pelvis was performed following the standard protocol without IV contrast. COMPARISON:  08/11/2012 FINDINGS: Lower chest: No acute pleural or parenchymal lung disease. Hepatobiliary: No focal liver abnormality is seen. No gallstones, gallbladder wall thickening, or biliary dilatation. Pancreas: Unremarkable. No pancreatic ductal dilatation or surrounding inflammatory changes. Spleen: Normal in size without focal abnormality. Adrenals/Urinary Tract: 6 mm nonobstructing calculus lower pole left kidney. Right kidney is unremarkable. No obstructive uropathy. There are multiple bladder diverticula, largest near the right UVJ. Otherwise the bladder is unremarkable. The adrenals are normal. Stomach/Bowel: No bowel obstruction or ileus. No bowel wall thickening or inflammatory change. Vascular/Lymphatic: No significant vascular findings are present. No enlarged abdominal or pelvic lymph nodes. Incidental retroaortic left renal vein, a frequent anatomic variant. Reproductive: Prostate is mildly enlarged measuring 4.3 x 5.0 by 4.5 cm. There is asymmetric enlargement of the right seminal vesicle. Mild fat stranding surrounds the seminal vesicle and prostate,  which could reflect underlying prostatitis. Other: No free fluid or free gas.  No abdominal wall hernia. Musculoskeletal: No acute or destructive bony lesions. Reconstructed images demonstrate no additional findings. IMPRESSION: 1. Enlarged prostate, with asymmetric enlargement of the right seminal vesicle and surrounding fat stranding, consistent with prostatitis. 2. 6 mm nonobstructing left renal calculus. Electronically Signed   By: Randa Ngo M.D.   On: 12/24/2019 21:27    ____________________________________________    PROCEDURES  Procedure(s) performed:    Procedures    Medications  acetaminophen (TYLENOL) tablet 650 mg (650 mg Oral Given 12/24/19 2136)  cefTRIAXone (ROCEPHIN) injection 1 g (1 g Intramuscular Given 12/24/19 2133)  sulfamethoxazole-trimethoprim (BACTRIM DS) 800-160 MG per tablet 1 tablet (1 tablet Oral Given 12/24/19 2235)  ____________________________________________   INITIAL IMPRESSION / ASSESSMENT AND PLAN / ED COURSE  Pertinent labs & imaging results that were available during my care of the patient were reviewed by me and considered in my medical decision making (see chart for details).  Review of the [place] CSRS was performed in accordance of the [place] prior to dispensing any controlled drugs.   Patient's diagnosis is consistent with prostatitis.  Vital signs and exam are reassuring.  Urinalysis is contributory to infection.  CT scan reveals findings consistent with prostatitis.  CT scan also shows a nonobstructing left renal calculi.  Patient refused rectal exam.  He appears well and is tolerating oral intake.  He received a shot of IM Rocephin and oral Bactrim in the emergency department.  Urine was sent for culture.  Gonorrhea and Chlamydia tests are pending.  Patient will be discharged home with prescriptions for Bactrim.  Patient is to follow up with urology as directed. Patient is given ED precautions to return to the ED for any worsening or new  symptoms.  HAWTHORNE DAY was evaluated in Emergency Department on 12/25/2019 for the symptoms described in the history of present illness. He was evaluated in the context of the global COVID-19 pandemic, which necessitated consideration that the patient might be at risk for infection with the SARS-CoV-2 virus that causes COVID-19. Institutional protocols and algorithms that pertain to the evaluation of patients at risk for COVID-19 are in a state of rapid change based on information released by regulatory bodies including the CDC and federal and state organizations. These policies and algorithms were followed during the patient's care in the ED.   ____________________________________________  FINAL CLINICAL IMPRESSION(S) / ED DIAGNOSES  Final diagnoses:  Acute prostatitis      NEW MEDICATIONS STARTED DURING THIS VISIT:  ED Discharge Orders         Ordered    sulfamethoxazole-trimethoprim (BACTRIM DS) 800-160 MG tablet  2 times daily,   Status:  Discontinued        12/24/19 2224    sulfamethoxazole-trimethoprim (BACTRIM DS) 800-160 MG tablet  2 times daily        12/24/19 2239              This chart was dictated using voice recognition software/Dragon. Despite best efforts to proofread, errors can occur which can change the meaning. Any change was purely unintentional.    Laban Emperor, PA-C 12/25/19 0030    Harvest Dark, MD 12/25/19 2032

## 2019-12-24 NOTE — ED Triage Notes (Signed)
Patient reports urinary frequency, urgency and dysuria that began yesterday.  Patient also reports he has not had a good BM if 4-5 days.

## 2019-12-25 LAB — CHLAMYDIA/NGC RT PCR (ARMC ONLY)
Chlamydia Tr: NOT DETECTED
N gonorrhoeae: NOT DETECTED

## 2019-12-27 LAB — URINE CULTURE: Culture: >=100000 — AB

## 2020-07-13 ENCOUNTER — Encounter: Payer: Self-pay | Admitting: Internal Medicine

## 2022-10-25 DIAGNOSIS — M1711 Unilateral primary osteoarthritis, right knee: Secondary | ICD-10-CM | POA: Insufficient documentation

## 2022-12-12 ENCOUNTER — Encounter (INDEPENDENT_AMBULATORY_CARE_PROVIDER_SITE_OTHER): Payer: Self-pay | Admitting: Vascular Surgery

## 2022-12-12 ENCOUNTER — Encounter (INDEPENDENT_AMBULATORY_CARE_PROVIDER_SITE_OTHER): Payer: 59 | Admitting: Vascular Surgery

## 2022-12-12 ENCOUNTER — Encounter (HOSPITAL_COMMUNITY): Payer: Self-pay | Admitting: Urgent Care

## 2022-12-12 ENCOUNTER — Ambulatory Visit (INDEPENDENT_AMBULATORY_CARE_PROVIDER_SITE_OTHER): Payer: 59 | Admitting: Vascular Surgery

## 2022-12-12 VITALS — BP 139/90 | HR 78 | Resp 16 | Wt 176.0 lb

## 2022-12-12 DIAGNOSIS — I2699 Other pulmonary embolism without acute cor pulmonale: Secondary | ICD-10-CM | POA: Insufficient documentation

## 2022-12-12 DIAGNOSIS — E119 Type 2 diabetes mellitus without complications: Secondary | ICD-10-CM

## 2022-12-12 DIAGNOSIS — I10 Essential (primary) hypertension: Secondary | ICD-10-CM

## 2022-12-12 DIAGNOSIS — I2782 Chronic pulmonary embolism: Secondary | ICD-10-CM | POA: Diagnosis not present

## 2022-12-12 NOTE — Assessment & Plan Note (Signed)
blood glucose control important in reducing the progression of atherosclerotic disease. Also, involved in wound healing. On appropriate medications.  

## 2022-12-12 NOTE — Assessment & Plan Note (Signed)
The patient has a significant history of pulmonary embolus after issues with his urinary tract about 17 years ago.  We had a long discussion today regarding the pathophysiology and natural history of thromboembolic issues.  We discussed that a knee replacement is a very high risk surgery for thromboembolic complications.  We discussed the fact that his DVT was isolated and provoked makes him a little lower risk, but he certainly still at higher risk than the general population.  I think it would be reasonable to consider a prophylactic IVC filter placement with subsequent removal.  It is also reasonable to just use perioperative anticoagulation to avoid thromboembolic complications.  The patient is very concerned about having a filter placed and removed due to his fear of needles.  I discussed that we can sedate him for this and that should not be his reason for having or not having the filter.  He wants to go home and discuss this with his wife but at this point he is leaning away from placement of the filter.

## 2022-12-12 NOTE — Progress Notes (Signed)
Patient ID: [name], male   DOB: Aug 26, 1965, 57 y.o.   MRN: [id number]  Chief Complaint  Patient presents with   New Patient (Initial Visit)    Ref [place] consult per-op iVC filter    HPI THERMAN TREBING is a 57 y.o. male.  I am asked to see the patient by Dr. Ernest Pine for evaluation of IVC filter placement preliminary to an upcoming right knee replacement scheduled for December 16.  The patient does have a previous history of DVT and pulmonary embolus approximately 17 years ago.  This occurred when he was highly immobile with urinary catheters in place and had surgery for this.  He was treated with 6 months of anticoagulation and has had no further issues.  He does not really have significant postphlebitic symptoms.  He does have pain from his osteoarthritis and this is the reason for his knee replacement.  No current chest pain or shortness of breath.     Past Medical History:  Diagnosis Date   Chronic urethral stricture HX DILATION    Diabetes mellitus without complication (HCC)    Headache, classical migraine    History of pulmonary embolus (PE) POST SURG. 2008   Hyperlipidemia    Hypertension    Seasonal allergies     Past Surgical History:  Procedure Laterality Date   BALLOON DILATION  06/20/2011   Procedure: BALLOON DILATION;  Surgeon: Sebastian Ache, MD;  [facility];  Service: Urology;  Laterality: Left;   CERVICAL FUSION  2004   C4 - 7   CYSTO/ RETROGRADE URETHROGRAM/ BALLOON DILATION OF URETHRAL STRICTURE  X5 LAST ONE 12-05-2011   FEB 2011/  2009 /  2008   CYSTOSCOPY WITH LITHOLAPAXY N/A 08/12/2012   Procedure: CYSTOSCOPY WITH LITHOLAPAXY;  Surgeon: Marcine Matar, MD;  Location: [facility];  Service: Urology;  Laterality: N/A;   CYSTOSCOPY WITH URETHRAL DILATATION  05/16/2011   Procedure: CYSTOSCOPY WITH URETHRAL DILATATION;  Surgeon: Martina Sinner, MD;  Location: [facility];  Service: Urology;  Laterality: N/A;  BALLOON  DILATION AND RETROGRADE    CYSTOSCOPY WITH URETHRAL DILATATION  12/30/2011   Procedure: CYSTOSCOPY WITH URETHRAL DILATATION;  Surgeon: Martina Sinner, MD;  [facility];  Service: Urology;  Laterality: N/A;  CYSTO, BALLOON DILATION, RETROGRADE URETHROGRAM   CYSTOSCOPY WITH URETHRAL DILATATION N/A 05/06/2012   Procedure: CYSTOSCOPY WITH BALLOON DILATATION;  Surgeon: Martina Sinner, MD;  [facility];  Service: Urology;  Laterality: N/A;   CYSTOSCOPY WITH URETHRAL DILATATION N/A 08/12/2012   Procedure: CYSTOSCOPY WITH URETHRAL DILATATION  CYSTOSCOPY WITH BALLOON DILATION OF URETHRAL STRICTURE;  Surgeon: Marcine Matar, MD;  [facility];  Service: Urology;  Laterality: N/A;   CYSTOSCOPY WITH URETHRAL DILATATION N/A 10/12/2012   Procedure: CYSTOSCOPY WITH URETHRAL DILATATION;  Surgeon: Sebastian Ache, MD;  Location[facility];  Service: Urology;  Laterality: N/A;  cysto, retrograde, urethrogram, balloon dilation of urethreal stricture   CYSTOSCOPY/RETROGRADE/URETEROSCOPY/STONE EXTRACTION WITH BASKET  06/20/2011   Procedure: CYSTOSCOPY/RETROGRADE/URETEROSCOPY/STONE EXTRACTION WITH BASKET;  Surgeon: Sebastian Ache, MD;  Location[facility];  Service: Urology;  Laterality: Left;   HYPOSPADIAS CORRECTION     Surgery for hypospadias as child   Hypospadius repair  child   INGUINAL HERNIA REPAIR  FEB 2011   LEFT   ORIF ELBOW FRACTURE     x2     Family History  Problem Relation Age of Onset   Lung cancer Mother    COPD Mother  Rheum arthritis Mother    Colon cancer Neg Hx      Social History   Tobacco Use   Smoking status: Never    Passive exposure: Yes   Smokeless tobacco: Never  Substance Use Topics   Alcohol use: Yes    Alcohol/week: 0.0 standard drinks of alcohol    Comment: OCCASIONAL   Drug use: No     No Known Allergies  Current Outpatient Medications  Medication Sig Dispense Refill   aspirin 81 MG tablet Take 81 mg by mouth daily.      atorvastatin (LIPITOR) 40 MG tablet Take 1 tablet by mouth daily.     diclofenac Sodium (VOLTAREN) 1 % GEL Apply 2 g topically daily as needed (pain).     famotidine (PEPCID) 20 MG tablet Take 20 mg by mouth 2 (two) times daily.     glipiZIDE (GLUCOTROL) 10 MG tablet Take 10 mg by mouth 2 (two) times daily before a meal.     lisinopril (PRINIVIL,ZESTRIL) 10 MG tablet Take 1 tablet (10 mg total) by mouth daily. (Patient taking differently: Take 40 mg by mouth daily.) 90 tablet 3   meloxicam (MOBIC) 15 MG tablet Take 15 mg by mouth daily.     metFORMIN (GLUCOPHAGE) 500 MG tablet TAKE 1 TABLET BY MOUTH TWICE A DAY WITH A MEAL 180 tablet 1   omeprazole (PRILOSEC OTC) 20 MG tablet Take 20 mg by mouth daily.     oxymetazoline (AFRIN) 0.05 % nasal spray Place 1 spray into both nostrils daily as needed for congestion. No drip nasal mist     rizatriptan (MAXALT) 10 MG tablet TAKE 1 TABLET BY MOUTH AS DIRECTED. MAY REPEAT IN 2 HOURS IF NEEDED FOR MIGRAINEHEADACHES 6 tablet 3   SUMAtriptan (IMITREX) 50 MG tablet TAKE 1 TABLET BY MOUTH EVERY 2 HOURS AS NEEDED FOR MIGRAINE AS DIRECTED 6 tablet 3   tamsulosin (FLOMAX) 0.4 MG CAPS capsule Take 0.4 mg by mouth daily.     No current facility-administered medications for this visit.      REVIEW OF SYSTEMS (Negative unless checked)  Constitutional: [] Weight loss  [] Fever  [] Chills Cardiac: [] Chest pain   [] Chest pressure   [] Palpitations   [] Shortness of breath when laying flat   [] Shortness of breath at rest   [] Shortness of breath with exertion. Vascular:  [] Pain in legs with walking   [] Pain in legs at rest   [] Pain in legs when laying flat   [] Claudication   [] Pain in feet when walking  [] Pain in feet at rest  [] Pain in feet when laying flat   [x] History of DVT   [] Phlebitis   [x] Swelling in legs   [] Varicose veins   [] Non-healing ulcers Pulmonary:   [] Uses home oxygen   [] Productive cough   [] Hemoptysis   [] Wheeze  [] COPD   [] Asthma Neurologic:   [] Dizziness  [] Blackouts   [] Seizures   [] History of stroke   [] History of TIA  [] Aphasia   [] Temporary blindness   [] Dysphagia   [] Weakness or numbness in arms   [] Weakness or numbness in legs Musculoskeletal:  [x] Arthritis   [] Joint swelling   [x] Joint pain   [] Low back pain Hematologic:  [] Easy bruising  [] Easy bleeding   [] Hypercoagulable state   [] Anemic  [] Hepatitis Gastrointestinal:  [] Blood in stool   [] Vomiting blood  [] Gastroesophageal reflux/heartburn   [] Abdominal pain Genitourinary:  [] Chronic kidney disease   [] Difficult urination  [] Frequent urination  [] Burning with urination   [] Hematuria Skin:  [] Rashes   []   Ulcers   [] Wounds Psychological:  [] History of anxiety   []  History of major depression.    Physical Exam BP (!) 139/90 (BP Location: Right Arm)   Pulse 78   Resp 16   Wt 176 lb (79.8 kg)   BMI 24.55 kg/m  Gen:  WD/WN, NAD Head: [place]/AT, No temporalis wasting Ear/Nose/Throat: Hearing grossly intact, nares w/o erythema or drainage, oropharynx w/o Erythema/Exudate Eyes: Conjunctiva clear, sclera non-icteric  Neck: trachea midline.  No JVD.  Pulmonary:  Good air movement, respirations not labored, no use of accessory muscles  Cardiac: RRR, no JVD Vascular:  Vessel Right Left  Radial Palpable Palpable                                   Gastrointestinal:. No masses, surgical incisions, or scars. Musculoskeletal: M/S 5/5 throughout.  Extremities without ischemic changes.  No deformity or atrophy. No edema. Neurologic: Sensation grossly intact in extremities.  Symmetrical.  Speech is fluent. Motor exam as listed above. Psychiatric: Judgment intact, Mood & affect appropriate for pt's clinical situation. Dermatologic: No rashes or ulcers noted.  No cellulitis or open wounds.    Radiology No results found.  Labs No results found for this or any previous visit (from the past 2160 hour(s)).  Assessment/Plan:  Pulmonary embolism (HCC) The patient has a  significant history of pulmonary embolus after issues with his urinary tract about 17 years ago.  We had a long discussion today regarding the pathophysiology and natural history of thromboembolic issues.  We discussed that a knee replacement is a very high risk surgery for thromboembolic complications.  We discussed the fact that his DVT was isolated and provoked makes him a little lower risk, but he certainly still at higher risk than the general population.  I think it would be reasonable to consider a prophylactic IVC filter placement with subsequent removal.  It is also reasonable to just use perioperative anticoagulation to avoid thromboembolic complications.  The patient is very concerned about having a filter placed and removed due to his fear of needles.  I discussed that we can sedate him for this and that should not be his reason for having or not having the filter.  He wants to go home and discuss this with his wife but at this point he is leaning away from placement of the filter.  Essential hypertension blood pressure control important in reducing the progression of atherosclerotic disease. On appropriate oral medications.   DM type 2 (diabetes mellitus, type 2) (HCC) blood glucose control important in reducing the progression of atherosclerotic disease. Also, involved in wound healing. On appropriate medications.      Festus Barren 12/12/2022, 10:16 AM   This note was created with Dragon medical transcription system.  Any errors from dictation are unintentional.

## 2022-12-12 NOTE — Patient Instructions (Signed)
Inferior Vena Cava Filter Insertion  Inferior vena cava filter insertion is a procedure in which a filter is placed into the large vein in the abdomen that carries blood from the lower part of the body to the heart. This vein is called the inferior vena cava (IVC). The inserted filter helps to prevent blood clots in the legs or pelvis from traveling to the lungs, which can be very dangerous. The filter is a small, metal device that is shaped like the spokes of an umbrella. The filter is inserted through a pathway that is created in the neck or groin. Filters are inserted when blood thinners (anticoagulants) cannot be used to prevent blood clots from forming. You may need filters rather than anticoagulants if you have: Severe platelet problems or shortages. Recent or current major bleeding that cannot be treated. Bleeding associated with anticoagulants. Bleeding in your head. Recent surgery or impending surgery. Tell a health care provider about: Any allergies you have, including iodine. All medicines you are taking, including vitamins, herbs, eye drops, creams, and over-the-counter medicines. Any problems you or family members have had with anesthetic medicines or with contrast dyes that are used during imaging tests. Any bleeding problems you have. Any surgeries you have had. Any medical conditions you have. Whether you are pregnant or may be pregnant. What are the risks? Generally, this is a safe procedure. However, problems may occur, including: Filter problems such as: The filter blocking the inferior vena cava. This can cause leg swelling. The filter not working properly as it gets older. The filter moving and traveling to the heart or lungs. Allergic reactions to medicines or dyes. Damage to nearby structures or organs. Infection. Bleeding, such as a pool of blood (hematoma) around the site where a long, thin tube is put into a large vein (catheter insertion site). Blood clot in  lower extremities called a deep vein thrombosis (DVT). What happens before the procedure? When to stop eating and drinking Follow instructions from your health care provider about what you may eat and drink before your procedure. These may include: 8 hours before your procedure Stop eating most foods. Do not eat meat, fried foods, or fatty foods. Eat only light foods, such as toast or crackers. All liquids are okay except energy drinks and alcohol. 6 hours before your procedure Stop eating. Drink only clear liquids, such as water, clear fruit juice, black coffee, plain tea, and sports drinks. Do not drink energy drinks or alcohol. 2 hours before your procedure Stop drinking all liquids. You may be allowed to take medicines with small sips of water. If you do not follow your health care provider's instructions, your procedure may be delayed or canceled. Medicines Ask your health care provider about: Changing or stopping your regular medicines. This is especially important if you are taking diabetes medicines or blood thinners. Taking medicines such as aspirin and ibuprofen. These medicines can thin your blood. Do not take these medicines unless your health care provider tells you to take them. Taking over-the-counter medicines, vitamins, herbs, and supplements. Surgery safety Ask your health care provider: How your surgery site will be marked. What steps will be taken to help prevent infection. These steps may include: Removing hair at the surgery site. Washing skin with a germ-killing soap. Receiving antibiotic medicine. General instructions You may have blood tests. These tests can help to tell how well your kidneys and liver are working. They can also show how fast your blood is clotting. Do not use any  products that contain nicotine or tobacco for at least 4 weeks before the procedure. These products include cigarettes, chewing tobacco, and vaping devices, such as e-cigarettes. If  you need help quitting, ask your health care provider. If you will be going home right after the procedure, plan to have a responsible adult: Take you home from the hospital or clinic. You will not be allowed to drive. Care for you for the time you are told. What happens during the procedure? An IV will be inserted into one of your veins. You will be given one or more of the following: A medicine to help you relax (sedative). A medicine to numb the area (local anesthetic). A medicine to make you fall asleep (general anesthetic). The procedure is done through a large vein in your neck or groin that leads to the IVC. An incision will be made over the vein. A long, thin tube (catheter) will be put into the vein. Contrast dye may be injected into the IVC to help guide the catheter. X-rays may be used to make sure that the catheter is in the correct position. The IVC filter will be inserted into the vein through the catheter until it reaches the correct location in the inferior vena cava. The catheter will be removed through the insertion site in your skin. Pressure will be applied to the insertion site to stop bleeding. A bandage (dressing) may be applied over the catheter insertion site. Your IV will be taken out. The procedure may vary among health care providers and hospitals. What happens after the procedure? Your blood pressure, heart rate, breathing rate, and blood oxygen level will be monitored until you leave the hospital or clinic. You may need to stay in bed (be on bed rest) for a period of time. Your insertion site will be monitored for the first few hours for any signs of bleeding. If you were given a sedative during the procedure, it can affect you for several hours. Do not drive or operate machinery until your health care provider says that it is safe. Summary The inferior vena cava filter helps to prevent blood clots in the legs or pelvis from traveling to your lungs. The IVC  filter is inserted when anticoagulants cannot be used to prevent blood clots. Plan to have a responsible adult care for you for the time you are told. This information is not intended to replace advice given to you by your health care provider. Make sure you discuss any questions you have with your health care provider. Document Revised: 01/08/2021 Document Reviewed: 01/08/2021 Elsevier Patient Education  2024 [employer].

## 2022-12-12 NOTE — Assessment & Plan Note (Signed)
blood pressure control important in reducing the progression of atherosclerotic disease. On appropriate oral medications.  

## 2022-12-14 NOTE — H&P (Signed)
ORTHOPAEDIC HISTORY & PHYSICAL [name], [place] - 12/12/2022 8:15 AM EST Formatting of this note is different from the original. Images from the original note were not included.   Chief Complaint: Chief Complaint Patient presents with Pre-op Exam H&P - RT TKA - 12.16.24/JPH  [name] is a 57 y.o. male who presents today for history and physical for right total knee arthroplasty with Dr. Ernest Pine on 12/22/2022. Patient has had several years of right knee pain mostly along the medial joint line. Pain has been increasing and becoming so severe that it is interfering with his quality of life and activities of daily living. Patient has had increasing swelling of the right knee. No instability. Pain is increased with going up and down stairs, ladders, standing and walking. He is unable to walk long distances. Patient has tried Tylenol, NSAIDs, cortisone injections, gel injections as well as activity modification with no improvement. Patient has seen Dr. Ernest Pine, discussed total knee arthroplasty and agreed exam procedure.  Past Medical History: Past Medical History: Diagnosis Date Chicken pox Diabetes mellitus type 2, uncomplicated (CMS/HHS-HCC) History of pulmonary embolus (PE) 06/24/2006 Hypertension Migraines  Past Surgical History: Past Surgical History: Procedure Laterality Date COLONOSCOPY 09/14/2020 Diverticulosis/Otherwise normal colon/Repeat 28yrs/TKT EGD 09/14/2020 Gastritis/Schatzki ring.Dilated/Repeat as needed/TKT NECK SURGERY WISDOM TEETH  Past Family History: Family History Problem Relation Age of Onset Heart disease Mother Diabetes type II Mother Lung cancer Mother Rheum arthritis Mother High blood pressure (Hypertension) Mother No Known Problems Father  Medications: Current Outpatient Medications Medication Sig Dispense Refill aspirin 81 MG EC tablet Take 81 mg by mouth once daily atorvastatin (LIPITOR) 40 MG tablet TAKE ONE TABLET BY MOUTH  ONCE A DAY 90 tablet 3 glipiZIDE (GLUCOTROL) 10 MG tablet Take 1 tablet (10 mg total) by mouth 2 (two) times daily before meals 60 tablet 11 meloxicam (MOBIC) 15 MG tablet TAKE ONE TABLET (15 MG TOTAL) BY MOUTH ONCE DAILY 30 tablet 2 metFORMIN (GLUCOPHAGE) 500 MG tablet TAKE ONE TABLET BY MOUTH TWICE A DAY WITH MEALS 180 tablet 3 omeprazole (PRILOSEC OTC) 20 MG EC tablet Take 20 mg by mouth once daily tamsulosin (FLOMAX) 0.4 mg capsule TAKE ONE CAPSULE BY MOUTH ONCE DAILY. TAKE 30 MINUTES BEFORE SAME MEAL EACH DAY 90 capsule 3  No current facility-administered medications for this visit.  Allergies: No Known Allergies  Review of Systems: A comprehensive 14 point ROS was performed, reviewed by me today, and the pertinent orthopaedic findings are documented in the HPI.  Exam: BP 138/88  Ht 180.3 cm (5\' 11" )  Wt 79.3 kg (174 lb 12.8 oz)  BMI 24.38 kg/m General: Well developed, well nourished, no apparent distress, normal affect, normal gait with no antalgic component.  HEENT: Head normocephalic, atraumatic, PERRL.  Abdomen: Soft, non tender, non distended, Bowel sounds present.  Heart: Examination of the heart reveals regular, rate, and rhythm. There is no murmur noted on ascultation. There is a normal apical pulse.  Lungs: Lungs are clear to auscultation. There is no wheeze, rhonchi, or crackles. There is normal expansion of bilateral chest walls.  Right knee: Examination of the right knee shows 0 to 125 degrees range of motion. Patient with varus alignment. Slight medial pseudolaxity. Patella tracks well. No swelling or edema throughout the right lower extremity. No effusion. No warmth or redness. Hip moves well with internal ex rotation with no discomfort.  X-rays of the right knee reviewed by me today from 10/24/2022 show complete loss of joint space in the medial compartment of the  right knee with varus deformity. Large patellofemoral osteophytes along the patella and medial  femoral condyle along the trochlea  Impression: Primary osteoarthritis of right knee [M17.11] Primary osteoarthritis of right knee (primary encounter diagnosis)  Plan: 48. 57 year old with advanced right knee degenerative arthritis with complete loss of joint space in the medial compartment of the right knee. He said years of increasing pain that has interfered to the point of affecting his quality of life and activities of daily living despite conservative treatment consisting of Tylenol, NSAIDs, cortisone injections and gel injections. Risks, benefits, complications of a right total knee arthroplasty have been discussed with the patient. Patient has agreed and consented procedure with Dr. Ernest Pine on 12/22/2022.  This note was generated in part with voice recognition software and I apologize for any typographical errors that were not detected and corrected.  [name] MPA-C  Chief Complaint: Chief Complaint Patient presents with Pre-op Exam H&P - RT TKA - 12.16.24/JPH  Medications: Current Outpatient Medications Medication Sig Dispense Refill aspirin 81 MG EC tablet Take 81 mg by mouth once daily atorvastatin (LIPITOR) 40 MG tablet TAKE ONE TABLET BY MOUTH ONCE A DAY 90 tablet 3 glipiZIDE (GLUCOTROL) 10 MG tablet Take 1 tablet (10 mg total) by mouth 2 (two) times daily before meals 60 tablet 11 meloxicam (MOBIC) 15 MG tablet TAKE ONE TABLET (15 MG TOTAL) BY MOUTH ONCE DAILY 30 tablet 2 metFORMIN (GLUCOPHAGE) 500 MG tablet TAKE ONE TABLET BY MOUTH TWICE A DAY WITH MEALS 180 tablet 3 omeprazole (PRILOSEC OTC) 20 MG EC tablet Take 20 mg by mouth once daily tamsulosin (FLOMAX) 0.4 mg capsule TAKE ONE CAPSULE BY MOUTH ONCE DAILY. TAKE 30 MINUTES BEFORE SAME MEAL EACH DAY 90 capsule 3  No current facility-administered medications for this visit.  Allergies: No Known Allergies  Past Medical History: Past Medical History: Diagnosis Date Chicken pox Diabetes mellitus type  2, uncomplicated (CMS/HHS-HCC) History of pulmonary embolus (PE) 06/24/2006 Hypertension Migraines  Past Surgical History: Past Surgical History: Procedure Laterality Date COLONOSCOPY 09/14/2020 Diverticulosis/Otherwise normal colon/Repeat 43yrs/TKT EGD 09/14/2020 Gastritis/Schatzki ring.Dilated/Repeat as needed/TKT NECK SURGERY WISDOM TEETH  Social History: Social History  Socioeconomic History Marital status: Married Spouse name: Debbie Number of children: 1 Years of education: 12 Highest education level: High school graduate Occupational History Occupation: Environmental education officer -Engineer, production Tobacco Use Smoking status: Never Smokeless tobacco: Never Vaping Use Vaping status: Never Used Substance and Sexual Activity Alcohol use: Yes Alcohol/week: 2.0 standard drinks of alcohol Types: 2 Standard drinks or equivalent per week Drug use: No Sexual activity: Yes Partners: Female  Family History: Family History Problem Relation Name Age of Onset Heart disease Mother Rogelio Seen Diabetes type II Mother Rogelio Seen Lung cancer Mother Rogelio Seen Rheum arthritis Mother Rogelio Seen High blood pressure (Hypertension) Mother Rogelio Seen No Known Problems Father  Review of Systems: A comprehensive 14 point ROS was performed, reviewed, and the pertinent orthopaedic findings are documented in the HPI.  Exam BP 138/88  Ht 180.3 cm (5\' 11" )  Wt 79.3 kg (174 lb 12.8 oz)  BMI 24.38 kg/m  General: Well-developed, well-nourished male seen in no acute distress. Antalgic gait. Varus thrust to the right knee.  HEENT: Atraumatic, normocephalic. Pupils are equal and reactive to light. Extraocular motion is intact. Sclera are clear. Oropharynx is clear with moist mucosa.  Neck: Supple, nontender, and with good ROM. No thyromegaly, adenopathy, JVD, or carotid bruits.  Lungs: Clear to auscultation bilaterally.  Cardiovascular: Regular rate and rhythm. Normal S1, S2.  No murmur . No appreciable gallops or rubs. Peripheral pulses are palpable. No lower extremity edema. Homan`s test is negative.  Abdomen: Soft, nontender, nondistended. Bowel sounds are present.  Extremities: Good strength, stability, and range of motion of the upper extremities. Good range of motion of the hips and ankles.  Right Knee: Soft tissue swelling: minimal Effusion: none Erythema: none Crepitance: minimal Tenderness: medial Alignment: relative varus Mediolateral laxity: medial pseudolaxity Posterior sag: negative Patellar tracking: Good tracking without evidence of subluxation or tilt Atrophy: No significant atrophy. Quadriceps tone was good. Range of motion: 0/0/137 degrees  Neurologic: Awake, alert, and oriented. Sensory function is intact to pinprick and light touch. Motor strength is judged to be 5/5. Motor coordination is within normal limits. No apparent clonus. No tremor.  X-rays: I ordered and interpreted standing AP, lateral, and sunrise radiographs of the right knee that were obtained in the office today. There is significant narrowing of the medial cartilage space with near bone-on-bone articulation and associated varus alignment. Osteophyte formation is noted. Subchondral sclerosis is noted. Bipartite patella is noted. No evidence of fracture or dislocation.  Impression: Degenerative arthrosis of the right knee  Plan: The findings were discussed in detail with the patient. The patient was given informational material on total knee replacement. Conservative treatment options were reviewed with the patient. We discussed the risks and benefits of surgical intervention. The usual perioperative course was also discussed in detail. The patient expressed understanding of the risks and benefits of surgical intervention and would like to proceed with plans for right total knee arthroplasty.  Hemoglobin A1c is an indication of glucose control. Uncontrolled  diabetes has been associated with perioperative complications including poor wound healing and surgical site infections. To decrease the risk of perioperative complications, hemoglobin A1c must be less than 8.0 prior to surgery. The patient is encouraged to work with his primary care physician and/or endocrinologist to optimize glucose management.  Lab Results Component Value Date HGBA1C 7.7 (H) 10/16/2022 HGBA1C 9.4 (H) 03/28/2022  I spent a total of 45 minutes in both face-to-face and non-face-to-face activities, excluding procedures performed, for this visit on the date of this encounter.  MEDICAL CLEARANCE: Per anesthesiology. ACTIVITY: As tolerated. WORK STATUS: Anticipate out of work for 6-8 weeks following surgery. THERAPY: Preoperative physical therapy evaluation. MEDICATIONS: Requested Prescriptions  No prescriptions requested or ordered in this encounter  FOLLOW-UP: No follow-ups on file.  Harold Moncus P. Angie Fava., M.D.  This note was generated in part with voice recognition software and I apologize for any typographical errors that were not detected and corrected.  Electronically signed by [name] Musca, PA at 12/12/2022 9:26 AM EST

## 2022-12-14 NOTE — Discharge Instructions (Signed)

## 2022-12-15 ENCOUNTER — Inpatient Hospital Stay
Admission: RE | Admit: 2022-12-15 | Discharge: 2022-12-15 | Disposition: A | Discharge status: Home / Self Care | Payer: Managed Care, Other (non HMO) | Source: Ambulatory Visit | Attending: Orthopedic Surgery | Admitting: Orthopedic Surgery

## 2022-12-15 VITALS — BP 142/95 | HR 96 | Resp 14 | Ht 71.0 in | Wt 177.7 lb

## 2022-12-15 DIAGNOSIS — E119 Type 2 diabetes mellitus without complications: Secondary | ICD-10-CM | POA: Diagnosis not present

## 2022-12-15 DIAGNOSIS — Z789 Other specified health status: Secondary | ICD-10-CM

## 2022-12-15 DIAGNOSIS — Z01810 Encounter for preprocedural cardiovascular examination: Secondary | ICD-10-CM | POA: Diagnosis not present

## 2022-12-15 DIAGNOSIS — M1711 Unilateral primary osteoarthritis, right knee: Secondary | ICD-10-CM | POA: Diagnosis not present

## 2022-12-15 DIAGNOSIS — I2782 Chronic pulmonary embolism: Secondary | ICD-10-CM | POA: Insufficient documentation

## 2022-12-15 DIAGNOSIS — G43709 Chronic migraine without aura, not intractable, without status migrainosus: Secondary | ICD-10-CM | POA: Diagnosis not present

## 2022-12-15 DIAGNOSIS — Z01818 Encounter for other preprocedural examination: Secondary | ICD-10-CM | POA: Insufficient documentation

## 2022-12-15 DIAGNOSIS — B372 Candidiasis of skin and nail: Secondary | ICD-10-CM | POA: Diagnosis not present

## 2022-12-15 HISTORY — DX: Gastro-esophageal reflux disease without esophagitis: K21.9

## 2022-12-15 HISTORY — DX: Hypospadias, unspecified: Q54.9

## 2022-12-15 HISTORY — DX: Personal history of urinary calculi: Z87.442

## 2022-12-15 HISTORY — DX: Type 2 diabetes mellitus without complications: E11.9

## 2022-12-15 HISTORY — DX: Other complications of anesthesia, initial encounter: T88.59XA

## 2022-12-15 HISTORY — DX: Unilateral primary osteoarthritis, right knee: M17.11

## 2022-12-15 HISTORY — DX: Anemia, unspecified: D64.9

## 2022-12-15 LAB — CBC
HCT: 38.1 % — ABNORMAL LOW (ref 39.0–52.0)
Hemoglobin: 13.4 g/dL (ref 13.0–17.0)
MCH: 29.9 pg (ref 26.0–34.0)
MCHC: 35.2 g/dL (ref 30.0–36.0)
MCV: 85 fL (ref 80.0–100.0)
Platelets: 233 10*3/uL (ref 150–400)
RBC: 4.48 MIL/uL (ref 4.22–5.81)
RDW: 12.4 % (ref 11.5–15.5)
WBC: 6.5 10*3/uL (ref 4.0–10.5)
nRBC: 0 % (ref 0.0–0.2)

## 2022-12-15 LAB — C-REACTIVE PROTEIN: CRP: 0.6 mg/dL (ref <1.0)

## 2022-12-15 LAB — HEMOGLOBIN A1C
Hgb A1c MFr Bld: 7.3 % — ABNORMAL HIGH (ref 4.8–5.6)
Mean Plasma Glucose: 162.81 mg/dL

## 2022-12-15 LAB — URINALYSIS, ROUTINE W REFLEX MICROSCOPIC
Bacteria, UA: NONE SEEN
Bilirubin Urine: NEGATIVE
Glucose, UA: >=500 mg/dL — AB
Hgb urine dipstick: NEGATIVE
Ketones, ur: NEGATIVE mg/dL
Leukocytes,Ua: NEGATIVE
Nitrite: NEGATIVE
Protein, ur: NEGATIVE mg/dL
RBC / HPF: 0 RBC/hpf (ref 0–5)
Specific Gravity, Urine: 1.021 (ref 1.005–1.030)
pH: 5 (ref 5.0–8.0)

## 2022-12-15 LAB — SEDIMENTATION RATE: Sed Rate: 5 mm/h (ref 0–20)

## 2022-12-15 LAB — SURGICAL PCR SCREEN
MRSA, PCR: NEGATIVE
Staphylococcus aureus: NEGATIVE

## 2022-12-15 NOTE — Patient Instructions (Addendum)
Your procedure is scheduled on:12-22-22 Monday Report to the Registration Desk on the 1st floor of the [facility].Then proceed to the 2nd floor Surgery Desk To find out your arrival time, please call [phone] between 1PM - 3PM on:12-19-22 Friday If your arrival time is 6:00 am, do not arrive before that time as the [facility] entrance doors do not open until 6:00 am.  REMEMBER: Instructions that are not followed completely may result in serious medical risk, up to and including death; or upon the discretion of your surgeon and anesthesiologist your surgery may need to be rescheduled.  Do not eat food after midnight the night before surgery.  No gum chewing or hard candies.  You may however, drink Water up to 2 hours before you are scheduled to arrive for your surgery. Do not drink anything within 2 hours of your scheduled arrival time.  In addition, your doctor has ordered for you to drink the provided:  Gatorade G2 Drinking this carbohydrate drink up to two hours before surgery helps to reduce insulin resistance and improve patient outcomes. Please complete drinking 2 hours before scheduled arrival time.  One week prior to surgery:Stop NOW (12-15-22) Stop Anti-inflammatories (NSAIDS) such as Advil, Aleve, Ibuprofen, Motrin, Naproxen, Naprosyn and Aspirin based products such as Excedrin, Goody's Powder, BC Powder. Stop ANY OVER THE COUNTER supplements until after surgery.  You may however, continue to take Tylenol if needed for pain up until the day of surgery.  Stop meloxicam (MOBIC) NOW (12-15-22)  Stop metFORMIN (GLUCOPHAGE) 2 days prior to surgery-Last dose will be on 12-19-22 Friday  Continue taking all of your other prescription medications up until the day of surgery.  ON THE DAY OF SURGERY ONLY TAKE THESE MEDICATIONS WITH SIPS OF WATER: -atorvastatin (LIPITOR)  -famotidine (PEPCID)  -tamsulosin (FLOMAX)   Continue your 81 mg Aspirin up until the day prior to  surgery-Do NOT take the morning of surgery  No Alcohol for 24 hours before or after surgery.  No Smoking including e-cigarettes for 24 hours before surgery.  No chewable tobacco products for at least 6 hours before surgery.  No nicotine patches on the day of surgery.  Do not use any "recreational" drugs for at least a week (preferably 2 weeks) before your surgery.  Please be advised that the combination of cocaine and anesthesia may have negative outcomes, up to and including death. If you test positive for cocaine, your surgery will be cancelled.  On the morning of surgery brush your teeth with toothpaste and water, you may rinse your mouth with mouthwash if you wish. Do not swallow any toothpaste or mouthwash.  Use CHG Soap as directed on instruction sheet.  Do not wear jewelry, make-up, hairpins, clips or nail polish.  For welded (permanent) jewelry: bracelets, anklets, waist bands, etc.  Please have this removed prior to surgery.  If it is not removed, there is a chance that hospital personnel will need to cut it off on the day of surgery.  Do not wear lotions, powders, or perfumes.   Do not shave body hair from the neck down 48 hours before surgery.  Contact lenses, hearing aids and dentures may not be worn into surgery.  Do not bring valuables to the hospital. [facility] is not responsible for any missing/lost belongings or valuables.   Notify your doctor if there is any change in your medical condition (cold, fever, infection).  Wear comfortable clothing (specific to your surgery type) to the hospital.  After surgery,  you can help prevent lung complications by doing breathing exercises.  Take deep breaths and cough every 1-2 hours. Your doctor may order a device called an Incentive Spirometer to help you take deep breaths. When coughing or sneezing, hold a pillow firmly against your incision with both hands. This is called "splinting." Doing this helps protect your  incision. It also decreases belly discomfort.  If you are being admitted to the hospital overnight, leave your suitcase in the car. After surgery it may be brought to your room.  In case of increased patient census, it may be necessary for you, the patient, to continue your postoperative care in the Same Day Surgery department.  If you are being discharged the day of surgery, you will not be allowed to drive home. You will need a responsible individual to drive you home and stay with you for 24 hours after surgery.   If you are taking public transportation, you will need to have a responsible individual with you.  Please call the Pre-admissions Testing Dept. at [phone] if you have any questions about these instructions.  Surgery Visitation Policy:  Patients having surgery or a procedure may have two visitors.  Children under the age of 72 must have an adult with them who is not the patient.  Inpatient Visitation:    Visiting hours are 7 a.m. to 8 p.m. Up to four visitors are allowed at one time in a patient room. The visitors may rotate out with other people during the day.  One visitor age 51 or older may stay with the patient overnight and must be in the room by 8 p.m.    Pre-operative 5 CHG Bath Instructions   You can play a key role in reducing the risk of infection after surgery. Your skin needs to be as free of germs as possible. You can reduce the number of germs on your skin by washing with CHG (chlorhexidine gluconate) soap before surgery. CHG is an antiseptic soap that kills germs and continues to kill germs even after washing.   DO NOT use if you have an allergy to chlorhexidine/CHG or antibacterial soaps. If your skin becomes reddened or irritated, stop using the CHG and notify one of our RNs at [phone].   Please shower with the CHG soap starting 4 days before surgery using the following schedule:     Please keep in mind the following:  DO NOT shave,  including legs and underarms, starting the day of your first shower.   You may shave your face at any point before/day of surgery.  Place clean sheets on your bed the day you start using CHG soap. Use a clean washcloth (not used since being washed) for each shower. DO NOT sleep with pets once you start using the CHG.   CHG Shower Instructions:  If you choose to wash your hair and private area, wash first with your normal shampoo/soap.  After you use shampoo/soap, rinse your hair and body thoroughly to remove shampoo/soap residue.  Turn the water OFF and apply about 3 tablespoons (45 ml) of CHG soap to a CLEAN washcloth.  Apply CHG soap ONLY FROM YOUR NECK DOWN TO YOUR TOES (washing for 3-5 minutes)  DO NOT use CHG soap on face, private areas, open wounds, or sores.  Pay special attention to the area where your surgery is being performed.  If you are having back surgery, having someone wash your back for you may be helpful. Wait 2 minutes after CHG  soap is applied, then you may rinse off the CHG soap.  Pat dry with a clean towel  Put on clean clothes/pajamas   If you choose to wear lotion, please use ONLY the CHG-compatible lotions on the back of this paper.     Additional instructions for the day of surgery: DO NOT APPLY any lotions, deodorants, cologne, or perfumes.   Put on clean/comfortable clothes.  Brush your teeth.  Ask your nurse before applying any prescription medications to the skin.      CHG Compatible Lotions   Aveeno Moisturizing lotion  Cetaphil Moisturizing Cream  Cetaphil Moisturizing Lotion  Clairol Herbal Essence Moisturizing Lotion, Dry Skin  Clairol Herbal Essence Moisturizing Lotion, Extra Dry Skin  Clairol Herbal Essence Moisturizing Lotion, Normal Skin  Curel Age Defying Therapeutic Moisturizing Lotion with Alpha Hydroxy  Curel Extreme Care Body Lotion  Curel Soothing Hands Moisturizing Hand Lotion  Curel Therapeutic Moisturizing Cream, Fragrance-Free   Curel Therapeutic Moisturizing Lotion, Fragrance-Free  Curel Therapeutic Moisturizing Lotion, Original Formula  Eucerin Daily Replenishing Lotion  Eucerin Dry Skin Therapy Plus Alpha Hydroxy Crme  Eucerin Dry Skin Therapy Plus Alpha Hydroxy Lotion  Eucerin Original Crme  Eucerin Original Lotion  Eucerin Plus Crme Eucerin Plus Lotion  Eucerin TriLipid Replenishing Lotion  Keri Anti-Bacterial Hand Lotion  Keri Deep Conditioning Original Lotion Dry Skin Formula Softly Scented  Keri Deep Conditioning Original Lotion, Fragrance Free Sensitive Skin Formula  Keri Lotion Fast Absorbing Fragrance Free Sensitive Skin Formula  Keri Lotion Fast Absorbing Softly Scented Dry Skin Formula  Keri Original Lotion  Keri Skin Renewal Lotion Keri Silky Smooth Lotion  Keri Silky Smooth Sensitive Skin Lotion  Nivea Body Creamy Conditioning Oil  Nivea Body Extra Enriched Lotion  Nivea Body Original Lotion  Nivea Body Sheer Moisturizing Lotion Nivea Crme  Nivea Skin Firming Lotion  NutraDerm 30 Skin Lotion  NutraDerm Skin Lotion  NutraDerm Therapeutic Skin Cream  NutraDerm Therapeutic Skin Lotion  ProShield Protective Hand Cream  Provon moisturizing lotion  How to Use an Incentive Spirometer An incentive spirometer is a tool that measures how well you are filling your lungs with each breath. Learning to take long, deep breaths using this tool can help you keep your lungs clear and active. This may help to reverse or lessen your chance of developing breathing (pulmonary) problems, especially infection. You may be asked to use a spirometer: After a surgery. If you have a lung problem or a history of smoking. After a long period of time when you have been unable to move or be active. If the spirometer includes an indicator to show the highest number that you have reached, your health care provider or respiratory therapist will help you set a goal. Keep a log of your progress as told by your health care  provider. What are the risks? Breathing too quickly may cause dizziness or cause you to pass out. Take your time so you do not get dizzy or light-headed. If you are in pain, you may need to take pain medicine before doing incentive spirometry. It is harder to take a deep breath if you are having pain. How to use your incentive spirometer  Sit up on the edge of your bed or on a chair. Hold the incentive spirometer so that it is in an upright position. Before you use the spirometer, breathe out normally. Place the mouthpiece in your mouth. Make sure your lips are closed tightly around it. Breathe in slowly and as deeply as you can  through your mouth, causing the piston or the ball to rise toward the top of the chamber. Hold your breath for 3-5 seconds, or for as long as possible. If the spirometer includes a coach indicator, use this to guide you in breathing. Slow down your breathing if the indicator goes above the marked areas. Remove the mouthpiece from your mouth and breathe out normally. The piston or ball will return to the bottom of the chamber. Rest for a few seconds, then repeat the steps 10 or more times. Take your time and take a few normal breaths between deep breaths so that you do not get dizzy or light-headed. Do this every 1-2 hours when you are awake. If the spirometer includes a goal marker to show the highest number you have reached (best effort), use this as a goal to work toward during each repetition. After each set of 10 deep breaths, cough a few times. This will help to make sure that your lungs are clear. If you have an incision on your chest or abdomen from surgery, place a pillow or a rolled-up towel firmly against the incision when you cough. This can help to reduce pain while taking deep breaths and coughing. General tips When you are able to get out of bed: Walk around often. Continue to take deep breaths and cough in order to clear your lungs. Keep using the  incentive spirometer until your health care provider says it is okay to stop using it. If you have been in the hospital, you may be told to keep using the spirometer at home. Contact a health care provider if: You are having difficulty using the spirometer. You have trouble using the spirometer as often as instructed. Your pain medicine is not giving enough relief for you to use the spirometer as told. You have a fever. Get help right away if: You develop shortness of breath. You develop a cough with bloody mucus from the lungs. You have fluid or blood coming from an incision site after you cough. Summary An incentive spirometer is a tool that can help you learn to take long, deep breaths to keep your lungs clear and active. You may be asked to use a spirometer after a surgery, if you have a lung problem or a history of smoking, or if you have been inactive for a long period of time. Use your incentive spirometer as instructed every 1-2 hours while you are awake. If you have an incision on your chest or abdomen, place a pillow or a rolled-up towel firmly against your incision when you cough. This will help to reduce pain. Get help right away if you have shortness of breath, you cough up bloody mucus, or blood comes from your incision when you cough. This information is not intended to replace advice given to you by your health care provider. Make sure you discuss any questions you have with your health care provider. Document Revised: 03/14/2019 Document Reviewed: 03/14/2019 Elsevier Patient Education  2024 [employer].

## 2022-12-22 ENCOUNTER — Ambulatory Visit
Admission: RE | Admit: 2022-12-22 | Payer: Managed Care, Other (non HMO) | Source: Home / Self Care | Admitting: Orthopedic Surgery

## 2022-12-22 ENCOUNTER — Encounter: Admission: RE | Payer: Self-pay | Source: Home / Self Care

## 2022-12-22 DIAGNOSIS — G43709 Chronic migraine without aura, not intractable, without status migrainosus: Secondary | ICD-10-CM

## 2022-12-22 DIAGNOSIS — B372 Candidiasis of skin and nail: Secondary | ICD-10-CM

## 2022-12-22 DIAGNOSIS — E119 Type 2 diabetes mellitus without complications: Secondary | ICD-10-CM

## 2022-12-22 DIAGNOSIS — M1711 Unilateral primary osteoarthritis, right knee: Secondary | ICD-10-CM

## 2022-12-22 DIAGNOSIS — I2782 Chronic pulmonary embolism: Secondary | ICD-10-CM

## 2022-12-22 SURGERY — ARTHROPLASTY, KNEE, TOTAL, USING IMAGELESS COMPUTER-ASSISTED NAVIGATION
Anesthesia: Choice | Site: Knee | Laterality: Right

## 2023-01-17 NOTE — Discharge Instructions (Addendum)
 Instructions after Total Knee Replacement   James P. Hooten, Jr., M.D.    Dept. of Orthopaedics & Sports Medicine [facility] [address] [place], [place]  [address]  Phone: [phone]   Fax: [phone]       [email or web address]       DIET: Drink plenty of non-alcoholic fluids. Resume your normal diet. Include foods high in fiber.  ACTIVITY:  You may use crutches or a walker with weight-bearing as tolerated, unless instructed otherwise. You may be weaned off of the walker or crutches by your Physical Therapist.  Do NOT place pillows under the knee. Anything placed under the knee could limit your ability to straighten the knee.   Continue doing gentle exercises. Exercising will reduce the pain and swelling, increase motion, and prevent muscle weakness.   Please continue to use the TED compression stockings for 6 weeks. You may remove the stockings at night, but should reapply them in the morning. Do not drive or operate any equipment until instructed.  WOUND CARE:  Continue to use the PolarCare or ice packs periodically to reduce pain and swelling. You may bathe or shower after the staples are removed at the first office visit following surgery.  MEDICATIONS: You may resume your regular medications. Please take the pain medication as prescribed on the medication. Do not take pain medication on an empty stomach. Take enteric-coated aspirin  twice a day. This along with elevation will help reduce the possibility of phlebitis in your operated leg. Do not drive or drink alcoholic beverages when taking pain medications.  CALL THE OFFICE FOR: Temperature above 101 degrees Excessive bleeding or drainage on the dressing. Excessive swelling, coldness, or paleness of the toes. Persistent nausea and vomiting.  FOLLOW-UP:  You should have an appointment to return to the office in 10-14 days after surgery. Arrangements have been made for continuation of Physical Therapy  (either home therapy or outpatient therapy).     [facility] Department Directory         [email or web address]       [email or web address]          Cardiology  Appointments: [place] - [phone]  Endocrinology  Appointments: [place] [phone] [place] - [phone]  Gastroenterology  Appointments: [place] [phone] [place] - [phone]        General Surgery   Appointments: [facility]  Internal Medicine/Family Medicine  Appointments: [facility] - [phone] [place] - [phone]  Metabolic and Weigh Loss Surgery  Appointments: [facility]        Neurology  Appointments: [place] [phone] [place] - [phone]  Neurosurgery  Appointments: [place]  Obstetrics & Gynecology  Appointments: [place] [phone] [place] - [phone]        Pediatrics  Appointments: [place] [phone] [place] - [phone]  Physiatry  Appointments: [place] [phone]  Physical Therapy  Appointments: [place] - [phone]        Podiatry  Appointments: [place] [phone] [place] - [phone]  Pulmonology  Appointments: [place]  Rheumatology  Appointments: [place] [phone]        [place] Location: [facility]  [address] [place], [place]  [address]  [place] Location: [facility] [address] [place], [place]  [address]  [place] Location: [facility] [address] [place], [place]  [address]

## 2023-01-19 ENCOUNTER — Other Ambulatory Visit: Payer: 59

## 2023-01-20 ENCOUNTER — Encounter
Admission: RE | Admit: 2023-01-20 | Discharge: 2023-01-20 | Disposition: A | Discharge status: Home / Self Care | Payer: BC Managed Care – PPO | Source: Ambulatory Visit | Attending: Orthopedic Surgery | Admitting: Orthopedic Surgery

## 2023-01-20 ENCOUNTER — Encounter: Payer: Self-pay | Admitting: Urgent Care

## 2023-01-20 ENCOUNTER — Other Ambulatory Visit: Payer: Self-pay

## 2023-01-20 ENCOUNTER — Encounter
Admission: RE | Admit: 2023-01-20 | Discharge: 2023-01-20 | Disposition: A | Discharge status: Home / Self Care | Payer: 59 | Source: Ambulatory Visit | Attending: Orthopedic Surgery | Admitting: Orthopedic Surgery

## 2023-01-20 DIAGNOSIS — E119 Type 2 diabetes mellitus without complications: Secondary | ICD-10-CM

## 2023-01-20 DIAGNOSIS — Z01812 Encounter for preprocedural laboratory examination: Secondary | ICD-10-CM | POA: Insufficient documentation

## 2023-01-20 DIAGNOSIS — M1710 Unilateral primary osteoarthritis, unspecified knee: Secondary | ICD-10-CM | POA: Insufficient documentation

## 2023-01-20 LAB — BASIC METABOLIC PANEL
Anion gap: 16 — ABNORMAL HIGH (ref 5–15)
BUN: 18 mg/dL (ref 6–20)
CO2: 19 mmol/L — ABNORMAL LOW (ref 22–32)
Calcium: 9.3 mg/dL (ref 8.9–10.3)
Chloride: 101 mmol/L (ref 98–111)
Creatinine, Ser: 1.37 mg/dL — ABNORMAL HIGH (ref 0.61–1.24)
GFR, Estimated: 60 mL/min — ABNORMAL LOW (ref >60)
Glucose, Bld: 207 mg/dL — ABNORMAL HIGH (ref 70–99)
Potassium: 4.2 mmol/L (ref 3.5–5.1)
Sodium: 136 mmol/L (ref 135–145)

## 2023-01-20 LAB — SURGICAL PCR SCREEN
MRSA, PCR: NEGATIVE
Staphylococcus aureus: NEGATIVE

## 2023-01-20 NOTE — Patient Instructions (Addendum)
 Your procedure is scheduled on:01-23-23 Friday Report to the Registration Desk on the 1st floor of the [facility]. To find out your arrival time, please call [phone] between 1PM - 3PM on: Thursday, Jan, 16, 2025  If your arrival time is 6:00 am, do not arrive before that time as the [facility] entrance doors do not open until 6:00 am.  REMEMBER: Instructions that are not followed completely may result in serious medical risk, up to and including death; or upon the discretion of your surgeon and anesthesiologist your surgery may need to be rescheduled.  Do not eat food after midnight the night before surgery.  No gum chewing or hard candies.  You may however, drink water  up to 2 hours before you are scheduled to arrive for your surgery. Do not drink anything within 2 hours of your scheduled arrival time.   In addition, your doctor has ordered for you to drink the provided:   Gatorade G2 Drinking this carbohydrate drink up to two hours before surgery helps to reduce insulin  resistance and improve patient outcomes. Please complete drinking 2 hours before scheduled arrival time.  One week prior to surgery:  Stop Anti-inflammatories (NSAIDS) such as Advil, Aleve, Ibuprofen, Motrin, Naproxen, Naprosyn and Aspirin  based products such as Excedrin, Goody's Powder, BC Powder and Mobic . Stop ANY OVER THE COUNTER supplements until after surgery.  You may however, continue to take Tylenol  if needed for pain up until the day of surgery.    DO NOT take metformin  two days before surgery. Last Dose is Jan 14   Continue taking Aspirin  until the day before surgery. Don't take on Day of Surgery. Last dose is Jan 16/2025  Continue taking all of your other prescription medications up until the day of surgery.  ON THE DAY OF SURGERY ONLY TAKE THESE MEDICATIONS WITH SIPS OF WATER :  atorvastatin  (LIPITOR) famotidine (PEPCID) tamsulosin  (FLOMAX )    No Alcohol for 24 hours before or after  surgery.  No Smoking including e-cigarettes for 24 hours before surgery.  No chewable tobacco products for at least 6 hours before surgery.  No nicotine patches on the day of surgery.  Do not use any recreational drugs for at least a week (preferably 2 weeks) before your surgery.  Please be advised that the combination of cocaine and anesthesia may have negative outcomes, up to and including death. If you test positive for cocaine, your surgery will be cancelled.  On the morning of surgery brush your teeth with toothpaste and water , you may rinse your mouth with mouthwash if you wish. Do not swallow any toothpaste or mouthwash.  Use CHG Soap or wipes as directed on instruction sheet.-provided for you   Do not wear jewelry, make-up, hairpins, clips or nail polish.  For welded (permanent) jewelry: bracelets, anklets, waist bands, etc.  Please have this removed prior to surgery.  If it is not removed, there is a chance that hospital personnel will need to cut it off on the day of surgery.  Do not wear lotions, powders, or perfumes.   Do not shave body hair from the neck down 48 hours before surgery.  Contact lenses, hearing aids and dentures may not be worn into surgery.  Do not bring valuables to the hospital. [facility] is not responsible for any missing/lost belongings or valuables.    Notify your doctor if there is any change in your medical condition (cold, fever, infection).  Wear comfortable clothing (specific to your surgery type) to the hospital.  After surgery, you can help prevent lung complications by doing breathing exercises.  Take deep breaths and cough every 1-2 hours. Your doctor may order a device called an Incentive Spirometer to help you take deep breaths.  If you are being admitted to the hospital overnight, leave your suitcase in the car. After surgery it may be brought to your room.   If you are being discharged the day of surgery, you will not be allowed  to drive home. You will need a responsible individual to drive you home and stay with you for 24 hours after surgery.    Please call the Pre-admissions Testing Dept. at [phone] if you have any questions about these instructions.  Surgery Visitation Policy:  Patients having surgery or a procedure may have two visitors.  Children under the age of 28 must have an adult with them who is not the patient.  Temporary Visitor Restrictions Due to increasing cases of flu, RSV and COVID-19: Children ages 3 and under will not be able to visit patients in [facility] hospitals under most circumstances.  Inpatient Visitation:    Visiting hours are 7 a.m. to 8 p.m. Up to four visitors are allowed at one time in a patient room. The visitors may rotate out with other people during the day.  One visitor age 74 or older may stay with the patient overnight and must be in the room by 8 p.m.     Pre-operative 5 CHG Bath Instructions   You can play a key role in reducing the risk of infection after surgery. Your skin needs to be as free of germs as possible. You can reduce the number of germs on your skin by washing with CHG (chlorhexidine  gluconate) soap before surgery. CHG is an antiseptic soap that kills germs and continues to kill germs even after washing.   DO NOT use if you have an allergy to chlorhexidine /CHG or antibacterial soaps. If your skin becomes reddened or irritated, stop using the CHG and notify one of our RNs at [phone].   Please shower with the CHG soap starting 4 days before surgery using the following schedule:     Please keep in mind the following:  DO NOT shave, including legs and underarms, starting the day of your first shower.   You may shave your face at any point before/day of surgery.  Place clean sheets on your bed the day you start using CHG soap. Use a clean washcloth (not used since being washed) for each shower. DO NOT sleep with pets once you start  using the CHG.   CHG Shower Instructions:  If you choose to wash your hair and private area, wash first with your normal shampoo/soap.  After you use shampoo/soap, rinse your hair and body thoroughly to remove shampoo/soap residue.  Turn the water  OFF and apply about 3 tablespoons (45 ml) of CHG soap to a CLEAN washcloth.  Apply CHG soap ONLY FROM YOUR NECK DOWN TO YOUR TOES (washing for 3-5 minutes)  DO NOT use CHG soap on face, private areas, open wounds, or sores.  Pay special attention to the area where your surgery is being performed.  If you are having back surgery, having someone wash your back for you may be helpful. Wait 2 minutes after CHG soap is applied, then you may rinse off the CHG soap.  Pat dry with a clean towel  Put on clean clothes/pajamas   If you choose to wear lotion, please use ONLY the  CHG-compatible lotions on the back of this paper.     Additional instructions for the day of surgery: DO NOT APPLY any lotions, deodorants, cologne, or perfumes.   Put on clean/comfortable clothes.  Brush your teeth.  Ask your nurse before applying any prescription medications to the skin.      CHG Compatible Lotions   Aveeno Moisturizing lotion  Cetaphil Moisturizing Cream  Cetaphil Moisturizing Lotion  Clairol Herbal Essence Moisturizing Lotion, Dry Skin  Clairol Herbal Essence Moisturizing Lotion, Extra Dry Skin  Clairol Herbal Essence Moisturizing Lotion, Normal Skin  Curel Age Defying Therapeutic Moisturizing Lotion with Alpha Hydroxy  Curel Extreme Care Body Lotion  Curel Soothing Hands Moisturizing Hand Lotion  Curel Therapeutic Moisturizing Cream, Fragrance-Free  Curel Therapeutic Moisturizing Lotion, Fragrance-Free  Curel Therapeutic Moisturizing Lotion, Original Formula  Eucerin Daily Replenishing Lotion  Eucerin Dry Skin Therapy Plus Alpha Hydroxy Crme  Eucerin Dry Skin Therapy Plus Alpha Hydroxy Lotion  Eucerin Original Crme  Eucerin Original Lotion   Eucerin Plus Crme Eucerin Plus Lotion  Eucerin TriLipid Replenishing Lotion  Keri Anti-Bacterial Hand Lotion  Keri Deep Conditioning Original Lotion Dry Skin Formula Softly Scented  Keri Deep Conditioning Original Lotion, Fragrance Free Sensitive Skin Formula  Keri Lotion Fast Absorbing Fragrance Free Sensitive Skin Formula  Keri Lotion Fast Absorbing Softly Scented Dry Skin Formula  Keri Original Lotion  Keri Skin Renewal Lotion Keri Silky Smooth Lotion  Keri Silky Smooth Sensitive Skin Lotion  Nivea Body Creamy Conditioning Oil  Nivea Body Extra Enriched Teacher, Adult Education Moisturizing Lotion Nivea Crme  Nivea Skin Firming Lotion  NutraDerm 30 Skin Lotion  NutraDerm Skin Lotion  NutraDerm Therapeutic Skin Cream  NutraDerm Therapeutic Skin Lotion  ProShield Protective Hand Cream  Provon moisturizing lotion

## 2023-01-22 MED ORDER — DEXAMETHASONE SODIUM PHOSPHATE 10 MG/ML IJ SOLN
8.0000 mg | Freq: Once | INTRAMUSCULAR | Status: AC
  Administered 2023-01-23: 8 mg via INTRAVENOUS

## 2023-01-22 MED ORDER — CELECOXIB 200 MG PO CAPS
400.0000 mg | ORAL_CAPSULE | Freq: Once | ORAL | Status: AC
  Administered 2023-01-23: 400 mg via ORAL

## 2023-01-22 MED ORDER — CHLORHEXIDINE GLUCONATE 4 % EX SOLN: 60.0000 mL | Freq: Once | CUTANEOUS | Status: DC

## 2023-01-22 MED ORDER — ORAL CARE MOUTH RINSE: 15.0000 mL | Freq: Once | OROMUCOSAL | Status: AC

## 2023-01-22 MED ORDER — GABAPENTIN 300 MG PO CAPS
300.0000 mg | ORAL_CAPSULE | Freq: Once | ORAL | Status: AC
  Administered 2023-01-23: 300 mg via ORAL

## 2023-01-22 MED ORDER — CHLORHEXIDINE GLUCONATE 0.12 % MT SOLN
15.0000 mL | Freq: Once | OROMUCOSAL | Status: AC
  Administered 2023-01-23: 15 mL via OROMUCOSAL

## 2023-01-22 MED ORDER — CEFAZOLIN SODIUM-DEXTROSE 2-4 GM/100ML-% IV SOLN
2.0000 g | INTRAVENOUS | Status: AC
  Administered 2023-01-23: 2 g via INTRAVENOUS

## 2023-01-22 MED ORDER — SODIUM CHLORIDE 0.9 % IV SOLN: INTRAVENOUS | Status: DC

## 2023-01-23 ENCOUNTER — Observation Stay
Admission: RE | Admit: 2023-01-23 | Discharge: 2023-01-24 | Disposition: A | Discharge status: Home Health | Payer: BC Managed Care – PPO | Attending: Orthopedic Surgery | Admitting: Orthopedic Surgery

## 2023-01-23 ENCOUNTER — Encounter
Admission: RE | Disposition: A | Discharge status: Home Health | Payer: Self-pay | Source: Home / Self Care | Attending: Orthopedic Surgery

## 2023-01-23 ENCOUNTER — Other Ambulatory Visit: Payer: Self-pay

## 2023-01-23 ENCOUNTER — Ambulatory Visit: Payer: BC Managed Care – PPO | Admitting: Anesthesiology

## 2023-01-23 ENCOUNTER — Observation Stay: Payer: BC Managed Care – PPO

## 2023-01-23 ENCOUNTER — Ambulatory Visit: Payer: BC Managed Care – PPO | Admitting: Urgent Care

## 2023-01-23 ENCOUNTER — Encounter: Payer: Self-pay | Admitting: Orthopedic Surgery

## 2023-01-23 DIAGNOSIS — E119 Type 2 diabetes mellitus without complications: Principal | ICD-10-CM | POA: Insufficient documentation

## 2023-01-23 DIAGNOSIS — Z7982 Long term (current) use of aspirin: Secondary | ICD-10-CM | POA: Insufficient documentation

## 2023-01-23 DIAGNOSIS — Z79899 Other long term (current) drug therapy: Secondary | ICD-10-CM | POA: Diagnosis not present

## 2023-01-23 DIAGNOSIS — Z7984 Long term (current) use of oral hypoglycemic drugs: Secondary | ICD-10-CM | POA: Diagnosis not present

## 2023-01-23 DIAGNOSIS — Z96651 Presence of right artificial knee joint: Secondary | ICD-10-CM

## 2023-01-23 DIAGNOSIS — M1711 Unilateral primary osteoarthritis, right knee: Secondary | ICD-10-CM | POA: Diagnosis present

## 2023-01-23 DIAGNOSIS — Z86711 Personal history of pulmonary embolism: Secondary | ICD-10-CM | POA: Diagnosis not present

## 2023-01-23 DIAGNOSIS — I10 Essential (primary) hypertension: Secondary | ICD-10-CM | POA: Diagnosis not present

## 2023-01-23 DIAGNOSIS — M1710 Unilateral primary osteoarthritis, unspecified knee: Secondary | ICD-10-CM

## 2023-01-23 DIAGNOSIS — Z01812 Encounter for preprocedural laboratory examination: Secondary | ICD-10-CM

## 2023-01-23 HISTORY — PX: KNEE ARTHROPLASTY: SHX992

## 2023-01-23 LAB — GLUCOSE, CAPILLARY
Glucose-Capillary: 200 mg/dL — ABNORMAL HIGH (ref 70–99)
Glucose-Capillary: 370 mg/dL — ABNORMAL HIGH (ref 70–99)
Glucose-Capillary: 299 mg/dL — ABNORMAL HIGH (ref 70–99)

## 2023-01-23 SURGERY — ARTHROPLASTY, KNEE, TOTAL, USING IMAGELESS COMPUTER-ASSISTED NAVIGATION
Anesthesia: General | Site: Knee | Laterality: Right

## 2023-01-23 MED ORDER — INSULIN ASPART 100 UNIT/ML IJ SOLN
INTRAMUSCULAR | Status: AC
  Filled 2023-01-23: qty 1

## 2023-01-23 MED ORDER — ATORVASTATIN CALCIUM 20 MG PO TABS
40.0000 mg | ORAL_TABLET | ORAL | Status: DC
  Administered 2023-01-24: 40 mg via ORAL
  Filled 2023-01-23: qty 2

## 2023-01-23 MED ORDER — OXYCODONE HCL 5 MG/5ML PO SOLN: 5.0000 mg | Freq: Once | ORAL | Status: AC | PRN

## 2023-01-23 MED ORDER — ONDANSETRON HCL 4 MG/2ML IJ SOLN
4.0000 mg | Freq: Four times a day (QID) | INTRAMUSCULAR | Status: DC | PRN
  Administered 2023-01-24: 4 mg via INTRAVENOUS
  Filled 2023-01-23: qty 2

## 2023-01-23 MED ORDER — METOCLOPRAMIDE HCL 10 MG PO TABS
10.0000 mg | ORAL_TABLET | Freq: Three times a day (TID) | ORAL | Status: DC
  Administered 2023-01-23 – 2023-01-24 (×2): 10 mg via ORAL
  Filled 2023-01-23 (×6): qty 1

## 2023-01-23 MED ORDER — MIDAZOLAM HCL 2 MG/2ML IJ SOLN
INTRAMUSCULAR | Status: AC
  Filled 2023-01-23: qty 2

## 2023-01-23 MED ORDER — CHLORHEXIDINE GLUCONATE 0.12 % MT SOLN
OROMUCOSAL | Status: AC
  Filled 2023-01-23: qty 15

## 2023-01-23 MED ORDER — INSULIN ASPART 100 UNIT/ML IJ SOLN
9.0000 [IU] | Freq: Once | INTRAMUSCULAR | Status: AC
  Administered 2023-01-23: 9 [IU] via SUBCUTANEOUS

## 2023-01-23 MED ORDER — CEFAZOLIN SODIUM-DEXTROSE 2-4 GM/100ML-% IV SOLN
2.0000 g | Freq: Four times a day (QID) | INTRAVENOUS | Status: AC
  Administered 2023-01-23 – 2023-01-24 (×2): 2 g via INTRAVENOUS
  Filled 2023-01-23 (×2): qty 100

## 2023-01-23 MED ORDER — ROCURONIUM BROMIDE 100 MG/10ML IV SOLN
INTRAVENOUS | Status: DC | PRN
  Administered 2023-01-23: 80 mg via INTRAVENOUS
  Administered 2023-01-23: 10 mg via INTRAVENOUS
  Administered 2023-01-23: 20 mg via INTRAVENOUS

## 2023-01-23 MED ORDER — TRANEXAMIC ACID-NACL 1000-0.7 MG/100ML-% IV SOLN
INTRAVENOUS | Status: AC
  Filled 2023-01-23: qty 100

## 2023-01-23 MED ORDER — PROPOFOL 10 MG/ML IV BOLUS
INTRAVENOUS | Status: AC
  Filled 2023-01-23: qty 20

## 2023-01-23 MED ORDER — LIDOCAINE HCL (PF) 2 % IJ SOLN
INTRAMUSCULAR | Status: AC
  Filled 2023-01-23: qty 5

## 2023-01-23 MED ORDER — EPHEDRINE 5 MG/ML INJ
INTRAVENOUS | Status: AC
  Filled 2023-01-23: qty 5

## 2023-01-23 MED ORDER — CEFAZOLIN SODIUM-DEXTROSE 2-4 GM/100ML-% IV SOLN
INTRAVENOUS | Status: AC
  Filled 2023-01-23: qty 100

## 2023-01-23 MED ORDER — FLEET ENEMA RE ENEM: 1.0000 | ENEMA | Freq: Once | RECTAL | Status: DC | PRN

## 2023-01-23 MED ORDER — ASPIRIN 81 MG PO CHEW
81.0000 mg | CHEWABLE_TABLET | Freq: Two times a day (BID) | ORAL | Status: DC
  Administered 2023-01-23 – 2023-01-24 (×2): 81 mg via ORAL
  Filled 2023-01-23 (×2): qty 1

## 2023-01-23 MED ORDER — SODIUM CHLORIDE 0.9 % IV SOLN: INTRAVENOUS | Status: DC

## 2023-01-23 MED ORDER — PROPOFOL 1000 MG/100ML IV EMUL
INTRAVENOUS | Status: AC
  Filled 2023-01-23: qty 100

## 2023-01-23 MED ORDER — ACETAMINOPHEN 325 MG PO TABS: 325.0000 mg | ORAL_TABLET | Freq: Four times a day (QID) | ORAL | Status: DC | PRN

## 2023-01-23 MED ORDER — SENNOSIDES-DOCUSATE SODIUM 8.6-50 MG PO TABS
1.0000 | ORAL_TABLET | Freq: Two times a day (BID) | ORAL | Status: DC
  Administered 2023-01-23 – 2023-01-24 (×2): 1 via ORAL
  Filled 2023-01-23 (×2): qty 1

## 2023-01-23 MED ORDER — GLIPIZIDE 10 MG PO TABS
10.0000 mg | ORAL_TABLET | Freq: Two times a day (BID) | ORAL | Status: DC
  Administered 2023-01-24: 10 mg via ORAL
  Filled 2023-01-23 (×2): qty 1

## 2023-01-23 MED ORDER — TRANEXAMIC ACID-NACL 1000-0.7 MG/100ML-% IV SOLN
1000.0000 mg | Freq: Once | INTRAVENOUS | Status: AC
  Administered 2023-01-23: 1000 mg via INTRAVENOUS

## 2023-01-23 MED ORDER — PHENYLEPHRINE HCL (PRESSORS) 10 MG/ML IV SOLN
INTRAVENOUS | Status: DC | PRN
  Administered 2023-01-23: 160 ug via INTRAVENOUS
  Administered 2023-01-23: 80 ug via INTRAVENOUS
  Administered 2023-01-23 (×3): 160 ug via INTRAVENOUS

## 2023-01-23 MED ORDER — ACETAMINOPHEN 10 MG/ML IV SOLN
1000.0000 mg | Freq: Four times a day (QID) | INTRAVENOUS | Status: DC
  Administered 2023-01-23 – 2023-01-24 (×2): 1000 mg via INTRAVENOUS
  Filled 2023-01-23 (×3): qty 100

## 2023-01-23 MED ORDER — TRANEXAMIC ACID-NACL 1000-0.7 MG/100ML-% IV SOLN
1000.0000 mg | INTRAVENOUS | Status: AC
Start: 2023-01-23 — End: 2023-01-23
  Administered 2023-01-23: 1000 mg via INTRAVENOUS

## 2023-01-23 MED ORDER — PHENOL 1.4 % MT LIQD: 1.0000 | OROMUCOSAL | Status: DC | PRN

## 2023-01-23 MED ORDER — FENTANYL CITRATE (PF) 100 MCG/2ML IJ SOLN
INTRAMUSCULAR | Status: DC | PRN
  Administered 2023-01-23 (×2): 50 ug via INTRAVENOUS

## 2023-01-23 MED ORDER — ALUM & MAG HYDROXIDE-SIMETH 200-200-20 MG/5ML PO SUSP: 30.0000 mL | ORAL | Status: DC | PRN

## 2023-01-23 MED ORDER — OXYCODONE HCL 5 MG PO TABS
5.0000 mg | ORAL_TABLET | ORAL | Status: DC | PRN
  Administered 2023-01-23 – 2023-01-24 (×3): 5 mg via ORAL
  Filled 2023-01-23 (×2): qty 1

## 2023-01-23 MED ORDER — PHENYLEPHRINE 80 MCG/ML (10ML) SYRINGE FOR IV PUSH (FOR BLOOD PRESSURE SUPPORT)
PREFILLED_SYRINGE | INTRAVENOUS | Status: DC | PRN
  Administered 2023-01-23: 80 ug via INTRAVENOUS

## 2023-01-23 MED ORDER — SUMATRIPTAN SUCCINATE 50 MG PO TABS: 50.0000 mg | ORAL_TABLET | ORAL | Status: DC | PRN

## 2023-01-23 MED ORDER — DEXAMETHASONE SODIUM PHOSPHATE 10 MG/ML IJ SOLN
INTRAMUSCULAR | Status: AC
  Filled 2023-01-23: qty 1

## 2023-01-23 MED ORDER — PROPOFOL 10 MG/ML IV BOLUS
INTRAVENOUS | Status: DC | PRN
  Administered 2023-01-23: 180 mg via INTRAVENOUS

## 2023-01-23 MED ORDER — FENTANYL CITRATE (PF) 100 MCG/2ML IJ SOLN
INTRAMUSCULAR | Status: AC
  Filled 2023-01-23: qty 2

## 2023-01-23 MED ORDER — PROPOFOL 500 MG/50ML IV EMUL
INTRAVENOUS | Status: DC | PRN
  Administered 2023-01-23: 75 ug/kg/min via INTRAVENOUS

## 2023-01-23 MED ORDER — ONDANSETRON HCL 4 MG/2ML IJ SOLN
INTRAMUSCULAR | Status: DC | PRN
  Administered 2023-01-23 (×2): 4 mg via INTRAVENOUS

## 2023-01-23 MED ORDER — OXYCODONE HCL 5 MG PO TABS
10.0000 mg | ORAL_TABLET | ORAL | Status: DC | PRN
  Filled 2023-01-23: qty 2

## 2023-01-23 MED ORDER — HYDROMORPHONE HCL 1 MG/ML IJ SOLN
INTRAMUSCULAR | Status: AC
  Filled 2023-01-23: qty 1

## 2023-01-23 MED ORDER — LIDOCAINE HCL (PF) 2 % IJ SOLN
INTRAMUSCULAR | Status: DC | PRN
  Administered 2023-01-23: 60 mg via INTRADERMAL

## 2023-01-23 MED ORDER — ONDANSETRON HCL 4 MG PO TABS
4.0000 mg | ORAL_TABLET | Freq: Four times a day (QID) | ORAL | Status: DC | PRN
Start: 2023-01-23 — End: 2023-01-24

## 2023-01-23 MED ORDER — LACTATED RINGERS IV SOLN: INTRAVENOUS | Status: DC | PRN

## 2023-01-23 MED ORDER — PHENYLEPHRINE 80 MCG/ML (10ML) SYRINGE FOR IV PUSH (FOR BLOOD PRESSURE SUPPORT)
PREFILLED_SYRINGE | INTRAVENOUS | Status: AC
  Filled 2023-01-23: qty 10

## 2023-01-23 MED ORDER — METFORMIN HCL 500 MG PO TABS
500.0000 mg | ORAL_TABLET | Freq: Two times a day (BID) | ORAL | Status: DC
  Administered 2023-01-24: 500 mg via ORAL
  Filled 2023-01-23: qty 1

## 2023-01-23 MED ORDER — SODIUM CHLORIDE FLUSH 0.9 % IV SOLN
INTRAVENOUS | Status: AC
  Filled 2023-01-23: qty 40

## 2023-01-23 MED ORDER — SODIUM CHLORIDE (PF) 0.9 % IJ SOLN
INTRAMUSCULAR | Status: DC | PRN
  Administered 2023-01-23: 120 mL

## 2023-01-23 MED ORDER — ACETAMINOPHEN 10 MG/ML IV SOLN
INTRAVENOUS | Status: AC
  Filled 2023-01-23: qty 100

## 2023-01-23 MED ORDER — TRAMADOL HCL 50 MG PO TABS: 50.0000 mg | ORAL_TABLET | Freq: Four times a day (QID) | ORAL | Status: DC | PRN

## 2023-01-23 MED ORDER — KETAMINE HCL 50 MG/5ML IJ SOSY
PREFILLED_SYRINGE | INTRAMUSCULAR | Status: DC | PRN
  Administered 2023-01-23: 20 mg via INTRAVENOUS

## 2023-01-23 MED ORDER — HYDROMORPHONE HCL 1 MG/ML IJ SOLN
0.5000 mg | INTRAMUSCULAR | Status: DC | PRN
  Administered 2023-01-23 – 2023-01-24 (×2): 1 mg via INTRAVENOUS
  Filled 2023-01-23 (×2): qty 1

## 2023-01-23 MED ORDER — BUPIVACAINE LIPOSOME 1.3 % IJ SUSP
INTRAMUSCULAR | Status: AC
  Filled 2023-01-23: qty 20

## 2023-01-23 MED ORDER — ROCURONIUM BROMIDE 10 MG/ML (PF) SYRINGE
PREFILLED_SYRINGE | INTRAVENOUS | Status: AC
  Filled 2023-01-23: qty 10

## 2023-01-23 MED ORDER — KETAMINE HCL 50 MG/5ML IJ SOSY
PREFILLED_SYRINGE | INTRAMUSCULAR | Status: AC
  Filled 2023-01-23: qty 5

## 2023-01-23 MED ORDER — PANTOPRAZOLE SODIUM 40 MG PO TBEC
40.0000 mg | DELAYED_RELEASE_TABLET | Freq: Two times a day (BID) | ORAL | Status: DC
  Administered 2023-01-23 – 2023-01-24 (×2): 40 mg via ORAL
  Filled 2023-01-23 (×2): qty 1

## 2023-01-23 MED ORDER — SODIUM CHLORIDE 0.9 % IR SOLN
Status: DC | PRN
  Administered 2023-01-23: 3000 mL

## 2023-01-23 MED ORDER — INSULIN ASPART 100 UNIT/ML IJ SOLN
0.0000 [IU] | Freq: Three times a day (TID) | INTRAMUSCULAR | Status: DC
Start: 2023-01-24 — End: 2023-01-24
  Administered 2023-01-24: 8 [IU] via SUBCUTANEOUS
  Filled 2023-01-23: qty 1

## 2023-01-23 MED ORDER — OXYCODONE HCL 5 MG PO TABS
5.0000 mg | ORAL_TABLET | Freq: Once | ORAL | Status: AC | PRN
  Administered 2023-01-23: 5 mg via ORAL

## 2023-01-23 MED ORDER — MENTHOL 3 MG MT LOZG
1.0000 | LOZENGE | OROMUCOSAL | Status: DC | PRN
Start: 2023-01-23 — End: 2023-01-24

## 2023-01-23 MED ORDER — TAMSULOSIN HCL 0.4 MG PO CAPS
0.4000 mg | ORAL_CAPSULE | Freq: Every day | ORAL | Status: DC
  Administered 2023-01-23 – 2023-01-24 (×2): 0.4 mg via ORAL
  Filled 2023-01-23 (×2): qty 1

## 2023-01-23 MED ORDER — EPHEDRINE SULFATE-NACL 50-0.9 MG/10ML-% IV SOSY
PREFILLED_SYRINGE | INTRAVENOUS | Status: DC | PRN
  Administered 2023-01-23 (×3): 5 mg via INTRAVENOUS

## 2023-01-23 MED ORDER — FENTANYL CITRATE (PF) 100 MCG/2ML IJ SOLN
25.0000 ug | INTRAMUSCULAR | Status: DC | PRN
  Administered 2023-01-23 (×2): 50 ug via INTRAVENOUS

## 2023-01-23 MED ORDER — BISACODYL 10 MG RE SUPP: 10.0000 mg | Freq: Every day | RECTAL | Status: DC | PRN

## 2023-01-23 MED ORDER — DIPHENHYDRAMINE HCL 12.5 MG/5ML PO ELIX: 12.5000 mg | ORAL_SOLUTION | ORAL | Status: DC | PRN

## 2023-01-23 MED ORDER — BUPIVACAINE HCL (PF) 0.25 % IJ SOLN
INTRAMUSCULAR | Status: AC
  Filled 2023-01-23: qty 60

## 2023-01-23 MED ORDER — ONDANSETRON HCL 4 MG/2ML IJ SOLN: 4.0000 mg | Freq: Once | INTRAMUSCULAR | Status: DC | PRN

## 2023-01-23 MED ORDER — DEXAMETHASONE SODIUM PHOSPHATE 10 MG/ML IJ SOLN
INTRAMUSCULAR | Status: DC | PRN
  Administered 2023-01-23: 10 mg via INTRAVENOUS

## 2023-01-23 MED ORDER — MIDAZOLAM HCL 2 MG/2ML IJ SOLN
INTRAMUSCULAR | Status: DC | PRN
  Administered 2023-01-23: 2 mg via INTRAVENOUS

## 2023-01-23 MED ORDER — GABAPENTIN 300 MG PO CAPS
ORAL_CAPSULE | ORAL | Status: AC
  Filled 2023-01-23: qty 1

## 2023-01-23 MED ORDER — BUPIVACAINE HCL (PF) 0.5 % IJ SOLN
INTRAMUSCULAR | Status: AC
  Filled 2023-01-23: qty 10

## 2023-01-23 MED ORDER — INSULIN ASPART 100 UNIT/ML IJ SOLN
0.0000 [IU] | Freq: Every day | INTRAMUSCULAR | Status: DC
  Administered 2023-01-23: 3 [IU] via SUBCUTANEOUS
  Filled 2023-01-23: qty 1

## 2023-01-23 MED ORDER — ACETAMINOPHEN 10 MG/ML IV SOLN
INTRAVENOUS | Status: DC | PRN
  Administered 2023-01-23: 1000 mg via INTRAVENOUS

## 2023-01-23 MED ORDER — CELECOXIB 200 MG PO CAPS
ORAL_CAPSULE | ORAL | Status: AC
  Filled 2023-01-23: qty 2

## 2023-01-23 MED ORDER — VASOPRESSIN 20 UNIT/ML IV SOLN
INTRAVENOUS | Status: DC | PRN
  Administered 2023-01-23 (×3): 2 [IU] via INTRAVENOUS

## 2023-01-23 MED ORDER — ACETAMINOPHEN 10 MG/ML IV SOLN: 1000.0000 mg | Freq: Once | INTRAVENOUS | Status: DC | PRN

## 2023-01-23 MED ORDER — FERROUS SULFATE 325 (65 FE) MG PO TABS
325.0000 mg | ORAL_TABLET | Freq: Two times a day (BID) | ORAL | Status: DC
  Administered 2023-01-24: 325 mg via ORAL
  Filled 2023-01-23: qty 1

## 2023-01-23 MED ORDER — OXYCODONE HCL 5 MG PO TABS
ORAL_TABLET | ORAL | Status: AC
  Filled 2023-01-23: qty 1

## 2023-01-23 MED ORDER — SURGIPHOR WOUND IRRIGATION SYSTEM - OPTIME: TOPICAL | Status: DC | PRN

## 2023-01-23 MED ORDER — ONDANSETRON HCL 4 MG/2ML IJ SOLN
INTRAMUSCULAR | Status: AC
  Filled 2023-01-23: qty 2

## 2023-01-23 MED ORDER — HYDROMORPHONE HCL 1 MG/ML IJ SOLN
INTRAMUSCULAR | Status: DC | PRN
  Administered 2023-01-23 (×2): .5 mg via INTRAVENOUS

## 2023-01-23 MED ORDER — MELOXICAM 7.5 MG PO TABS
15.0000 mg | ORAL_TABLET | Freq: Every day | ORAL | Status: DC
  Administered 2023-01-24: 15 mg via ORAL
  Filled 2023-01-23: qty 2

## 2023-01-23 MED ORDER — PHENYLEPHRINE HCL-NACL 20-0.9 MG/250ML-% IV SOLN
INTRAVENOUS | Status: DC | PRN
  Administered 2023-01-23: 30 ug/min via INTRAVENOUS

## 2023-01-23 MED ORDER — MAGNESIUM HYDROXIDE 400 MG/5ML PO SUSP
30.0000 mL | Freq: Every day | ORAL | Status: DC
  Administered 2023-01-23 – 2023-01-24 (×2): 30 mL via ORAL
  Filled 2023-01-23 (×2): qty 30

## 2023-01-23 SURGICAL SUPPLY — 66 items
ATTUNE MED DOME PAT 38 KNEE (Knees) IMPLANT
ATTUNE PS FEM RT SZ 5 CEM KNEE (Femur) IMPLANT
ATTUNE PSRP INSR SZ5 5 KNEE (Insert) IMPLANT
BASE TIBIAL ROT PLAT SZ 5 KNEE (Knees) IMPLANT
BATTERY INSTRU NAVIGATION (MISCELLANEOUS) ×4 IMPLANT
BIT DRILL QUICK REL 1/8 2PK SL (BIT) ×1 IMPLANT
BLADE CLIPPER SURG (BLADE) IMPLANT
BLADE SAW 70X12.5 (BLADE) ×1 IMPLANT
BLADE SAW 90X13X1.19 OSCILLAT (BLADE) ×1 IMPLANT
BLADE SAW 90X25X1.19 OSCILLAT (BLADE) ×1 IMPLANT
BONE CEMENT GENTAMICIN (Cement) ×2 IMPLANT
BRUSH SCRUB EZ PLAIN DRY (MISCELLANEOUS) ×1 IMPLANT
CEMENT BONE GENTAMICIN 40 (Cement) IMPLANT
COOLER POLAR GLACIER W/PUMP (MISCELLANEOUS) ×1 IMPLANT
CUFF TRNQT CYL 24X4X16.5-23 (TOURNIQUET CUFF) IMPLANT
CUFF TRNQT CYL 30X4X21-28X (TOURNIQUET CUFF) IMPLANT
DRAPE SHEET LG 3/4 BI-LAMINATE (DRAPES) ×1 IMPLANT
DRSG AQUACEL AG ADV 3.5X14 (GAUZE/BANDAGES/DRESSINGS) ×1 IMPLANT
DRSG MEPILEX SACRM 8.7X9.8 (GAUZE/BANDAGES/DRESSINGS) ×1 IMPLANT
DRSG TEGADERM 4X4.75 (GAUZE/BANDAGES/DRESSINGS) ×1 IMPLANT
DURAPREP 26ML APPLICATOR (WOUND CARE) ×2 IMPLANT
ELECT CAUTERY BLADE 6.4 (BLADE) ×1 IMPLANT
ELECT REM PT RETURN 9FT ADLT (ELECTROSURGICAL) ×1
ELECTRODE REM PT RTRN 9FT ADLT (ELECTROSURGICAL) ×1 IMPLANT
EVACUATOR 1/8 PVC DRAIN (DRAIN) ×1 IMPLANT
EX-PIN ORTHOLOCK NAV 4X150 (PIN) ×2 IMPLANT
GAUZE XEROFORM 1X8 LF (GAUZE/BANDAGES/DRESSINGS) ×1 IMPLANT
GLOVE BIOGEL M STRL SZ7.5 (GLOVE) ×6 IMPLANT
GLOVE SURG UNDER POLY LF SZ8 (GLOVE) ×2 IMPLANT
GOWN STRL REUS W/ TWL LRG LVL3 (GOWN DISPOSABLE) ×1 IMPLANT
GOWN STRL REUS W/ TWL XL LVL3 (GOWN DISPOSABLE) ×1 IMPLANT
GOWN TOGA ZIPPER T7+ PEEL AWAY (MISCELLANEOUS) ×1 IMPLANT
HOLDER FOLEY CATH W/STRAP (MISCELLANEOUS) ×1 IMPLANT
HOOD PEEL AWAY T7 (MISCELLANEOUS) ×1 IMPLANT
IV NS IRRIG 3000ML ARTHROMATIC (IV SOLUTION) ×1 IMPLANT
KIT TURNOVER KIT A (KITS) ×1 IMPLANT
KNIFE SCULPS 14X20 (INSTRUMENTS) ×1 IMPLANT
MANIFOLD NEPTUNE II (INSTRUMENTS) ×2 IMPLANT
NDL SPNL 20GX3.5 QUINCKE YW (NEEDLE) ×2 IMPLANT
NEEDLE SPNL 20GX3.5 QUINCKE YW (NEEDLE) ×2
PACK TOTAL KNEE (MISCELLANEOUS) ×1 IMPLANT
PAD ABD DERMACEA PRESS 5X9 (GAUZE/BANDAGES/DRESSINGS) ×2 IMPLANT
PAD ARMBOARD 7.5X6 YLW CONV (MISCELLANEOUS) ×3 IMPLANT
PAD WRAPON POLAR KNEE (MISCELLANEOUS) ×1 IMPLANT
PENCIL SMOKE EVACUATOR COATED (MISCELLANEOUS) ×1 IMPLANT
PIN DRILL FIX HALF THREAD (BIT) ×2 IMPLANT
PIN FIXATION 1/8DIA X 3INL (PIN) ×1 IMPLANT
PULSAVAC PLUS IRRIG FAN TIP (DISPOSABLE) ×1
SOLUTION IRRIG SURGIPHOR (IV SOLUTION) ×1 IMPLANT
SPONGE DRAIN TRACH 4X4 STRL 2S (GAUZE/BANDAGES/DRESSINGS) ×1 IMPLANT
STAPLER SKIN PROX 35W (STAPLE) ×1 IMPLANT
STOCKINETTE STRL BIAS CUT 8X4 (MISCELLANEOUS) ×1 IMPLANT
STRAP TIBIA SHORT (MISCELLANEOUS) ×1 IMPLANT
SUCTION TUBE FRAZIER 10FR DISP (SUCTIONS) ×1 IMPLANT
SUT VIC AB 0 CT1 36 (SUTURE) ×1 IMPLANT
SUT VIC AB 1 CT1 36 (SUTURE) ×2 IMPLANT
SUT VIC AB 2-0 CT2 27 (SUTURE) ×1 IMPLANT
SYR 30ML LL (SYRINGE) ×2 IMPLANT
TIBIAL BASE ROT PLAT SZ 5 KNEE (Knees) ×1 IMPLANT
TIP FAN IRRIG PULSAVAC PLUS (DISPOSABLE) ×1 IMPLANT
TOWEL OR 17X26 4PK STRL BLUE (TOWEL DISPOSABLE) ×1 IMPLANT
TOWER CARTRIDGE SMART MIX (DISPOSABLE) ×1 IMPLANT
TRAP FLUID SMOKE EVACUATOR (MISCELLANEOUS) ×1 IMPLANT
TRAY FOLEY MTR SLVR 16FR STAT (SET/KITS/TRAYS/PACK) ×1 IMPLANT
WATER STERILE IRR 1000ML POUR (IV SOLUTION) ×1 IMPLANT
WRAPON POLAR PAD KNEE (MISCELLANEOUS) ×1

## 2023-01-23 NOTE — Interval H&P Note (Signed)
History and Physical Interval Note:  01/23/2023 11:40 AM  [name]  has presented today for surgery, with the diagnosis of PRIMARY OSTEOARTHRITIS OF RIGHT KNEE..  The various methods of treatment have been discussed with the patient and family. After consideration of risks, benefits and other options for treatment, the patient has consented to  Procedure(s): COMPUTER ASSISTED TOTAL KNEE ARTHROPLASTY (Right) as a surgical intervention.  The patient's history has been reviewed, patient examined, no change in status, stable for surgery.  I have reviewed the patient's chart and labs.  Questions were answered to the patient's satisfaction.    Of note, the patient has a history of pulmonary embolism and was referred to Vascular Surgery preoperatively for consideration of an IVC filter. The consult note was reviewed. Despite the recommendation by Dr. Wyn Quaker for the IVC filter, the patient refused placement of the filter.   [name] P Kimm Ungaro

## 2023-01-23 NOTE — Op Note (Signed)
OPERATIVE NOTE  DATE OF SURGERY:  01/23/2023  PATIENT NAME:  [name]   DOB: 11-17-1965  MRN: [id number]  PRE-OPERATIVE DIAGNOSIS: Degenerative arthrosis of the right knee, primary  POST-OPERATIVE DIAGNOSIS:  Same  PROCEDURE:  Right total knee arthroplasty using computer-assisted navigation  SURGEON:  Jena Gauss. M.D.  ASSISTANT:  Gean Birchwood, PA-C (present and scrubbed throughout the case, critical for assistance with exposure, retraction, instrumentation, and closure)  ANESTHESIA: general  ESTIMATED BLOOD LOSS: 50 mL  FLUIDS REPLACED: 700 mL of crystalloid  TOURNIQUET TIME: 86 minutes  DRAINS: 2 medium Hemovac drains  SOFT TISSUE RELEASES: Anterior cruciate ligament, posterior cruciate ligament, deep and superficial medial collateral ligament, patellofemoral ligament  IMPLANTS UTILIZED: DePuy Attune size 5 posterior stabilized femoral component (cemented), size 5 rotating platform tibial component (cemented), 38 mm medialized dome patella (cemented), and a 5 mm stabilized rotating platform polyethylene insert.  INDICATIONS FOR SURGERY: [name] is a 58 y.o. year old male with a long history of progressive knee pain. X-rays demonstrated severe degenerative changes in tricompartmental fashion. The patient had not seen any significant improvement despite conservative nonsurgical intervention. After discussion of the risks and benefits of surgical intervention, the patient expressed understanding of the risks benefits and agree with plans for total knee arthroplasty.   The risks, benefits, and alternatives were discussed at length including but not limited to the risks of infection, bleeding, nerve injury, stiffness, blood clots, the need for revision surgery, cardiopulmonary complications, among others, and they were willing to proceed.  PROCEDURE IN DETAIL: The patient was brought into the operating room and, after adequate general anesthesia was achieved, a  tourniquet was placed on the patient's upper thigh. The patient's knee and leg were cleaned and prepped with alcohol and DuraPrep and draped in the usual sterile fashion. A "timeout" was performed as per usual protocol. The lower extremity was exsanguinated using an Esmarch, and the tourniquet was inflated to 300 mmHg. An anterior longitudinal incision was made followed by a standard mid vastus approach. The deep fibers of the medial collateral ligament were elevated in a subperiosteal fashion off of the medial flare of the tibia so as to maintain a continuous soft tissue sleeve. The patella was subluxed laterally and the patellofemoral ligament was incised. Inspection of the knee demonstrated severe degenerative changes with full-thickness loss of articular cartilage. Osteophytes were debrided using a rongeur. Anterior and posterior cruciate ligaments were excised. Two 4.0 mm Schanz pins were inserted in the femur and into the tibia for attachment of the array of trackers used for computer-assisted navigation. Hip center was identified using a circumduction technique. Distal landmarks were mapped using the computer. The distal femur and proximal tibia were mapped using the computer. The distal femoral cutting guide was positioned using computer-assisted navigation so as to achieve a 5 distal valgus cut. The femur was sized and it was felt that a size 5 femoral component was appropriate. A size 5 femoral cutting guide was positioned and the anterior cut was performed and verified using the computer. This was followed by completion of the posterior and chamfer cuts. Femoral cutting guide for the central box was then positioned in the center box cut was performed.  Attention was then directed to the proximal tibia. Medial and lateral menisci were excised. The extramedullary tibial cutting guide was positioned using computer-assisted navigation so as to achieve a 0 varus-valgus alignment and 3 posterior slope. The  cut was performed and verified using the computer. The  proximal tibia was sized and it was felt that a size 5 tibial tray was appropriate. Tibial and femoral trials were inserted followed by insertion of a 5 mm polyethylene insert. The knee was felt to be tight medially. A Cobb elevator was used to elevate the superficial fibers of the medial collateral ligament.  This allowed for excellent mediolateral soft tissue balancing both in flexion and in full extension. Finally, the patella was cut and prepared so as to accommodate a 38 mm medialized dome patella. A patella trial was placed and the knee was placed through a range of motion with excellent patellar tracking appreciated. The femoral trial was removed after debridement of posterior osteophytes. The central post-hole for the tibial component was reamed followed by insertion of a keel punch. Tibial trials were then removed. Cut surfaces of bone were irrigated with copious amounts of normal saline using pulsatile lavage and then suctioned dry. Polymethylmethacrylate cement with gentamicin was prepared in the usual fashion using a vacuum mixer. Cement was applied to the cut surface of the proximal tibia as well as along the undersurface of a size 5 rotating platform tibial component. Tibial component was positioned and impacted into place. Excess cement was removed using Personal assistant. Cement was then applied to the cut surfaces of the femur as well as along the posterior flanges of the size 5 femoral component. The femoral component was positioned and impacted into place. Excess cement was removed using Personal assistant. A 5 mm polyethylene trial was inserted and the knee was brought into full extension with steady axial compression applied. Finally, cement was applied to the backside of a 38 mm medialized dome patella and the patellar component was positioned and patellar clamp applied. Excess cement was removed using Personal assistant. After adequate curing of  the cement, the tourniquet was deflated after a total tourniquet time of 86 minutes. Hemostasis was achieved using electrocautery. The knee was irrigated with copious amounts of normal saline using pulsatile lavage followed by 450 ml of Surgiphor and then suctioned dry. 20 mL of 1.3% Exparel and 60 mL of 0.25% Marcaine in 40 mL of normal saline was injected along the posterior capsule, medial and lateral gutters, and along the arthrotomy site. A 5 mm stabilized rotating platform polyethylene insert was inserted and the knee was placed through a range of motion with excellent mediolateral soft tissue balancing appreciated and excellent patellar tracking noted. 2 medium drains were placed in the wound bed and brought out through separate stab incisions. The medial parapatellar portion of the incision was reapproximated using interrupted sutures of #1 Vicryl. Subcutaneous tissue was approximated in layers using first #0 Vicryl followed #2-0 Vicryl. The skin was approximated with skin staples. A sterile dressing was applied.  The patient tolerated the procedure well and was transported to the recovery room in stable condition.    Sid Greener P. Angie Fava., M.D.

## 2023-01-23 NOTE — H&P (Signed)
ORTHOPAEDIC HISTORY & PHYSICAL [name], [place] - 01/16/2023 8:30 AM EST Formatting of this note is different from the original. Images from the original note were not included.   Chief Complaint: Chief Complaint Patient presents with Right Knee - Pre-op Exam H & P R TKA 01/23/2023- [facility]  [name] is a 58 y.o. male who presents today for history and physical for right total knee arthroplasty with Dr. Ernest Pine on 01/23/2023. Patient has had several years of right knee pain mostly along the medial joint line. Pain has been increasing and becoming so severe that it is interfering with his quality of life and activities of daily living. Patient has had increasing swelling of the right knee. No instability. Pain is increased with going up and down stairs, ladders, standing and walking. He is unable to walk long distances. Patient has tried Tylenol, NSAIDs, cortisone injections, gel injections as well as activity modification with no improvement. Patient has seen Dr. Ernest Pine, discussed total knee arthroplasty and agreed exam procedure.  Past Medical History: Past Medical History: Diagnosis Date Chicken pox Diabetes mellitus type 2, uncomplicated (CMS/HHS-HCC) History of pulmonary embolus (PE) 06/24/2006 Hypertension Migraines  Past Surgical History: Past Surgical History: Procedure Laterality Date COLONOSCOPY 09/14/2020 Diverticulosis/Otherwise normal colon/Repeat 61yrs/TKT EGD 09/14/2020 Gastritis/Schatzki ring.Dilated/Repeat as needed/TKT NECK SURGERY WISDOM TEETH  Past Family History: Family History Problem Relation Age of Onset Heart disease Mother Diabetes type II Mother Lung cancer Mother Rheum arthritis Mother High blood pressure (Hypertension) Mother No Known Problems Father  Medications: Current Outpatient Medications Medication Sig Dispense Refill aspirin 81 MG EC tablet Take 81 mg by mouth once daily atorvastatin (LIPITOR) 40 MG tablet TAKE ONE TABLET  BY MOUTH ONCE A DAY 90 tablet 3 glipiZIDE (GLUCOTROL) 10 MG tablet Take 1 tablet (10 mg total) by mouth 2 (two) times daily before meals 60 tablet 11 meloxicam (MOBIC) 15 MG tablet TAKE ONE TABLET (15 MG TOTAL) BY MOUTH ONCE DAILY 30 tablet 2 metFORMIN (GLUCOPHAGE) 500 MG tablet TAKE ONE TABLET BY MOUTH TWICE A DAY WITH MEALS 180 tablet 3 omeprazole (PRILOSEC OTC) 20 MG EC tablet Take 20 mg by mouth once daily tamsulosin (FLOMAX) 0.4 mg capsule TAKE ONE CAPSULE BY MOUTH ONCE DAILY. TAKE 30 MINUTES BEFORE SAME MEAL EACH DAY 90 capsule 3  No current facility-administered medications for this visit.  Allergies: No Known Allergies  Review of Systems: A comprehensive 14 point ROS was performed, reviewed by me today, and the pertinent orthopaedic findings are documented in the HPI.  Exam: BP (!) 132/90  Ht 180.3 cm (5\' 11" )  Wt 81 kg (178 lb 9.6 oz)  BMI 24.91 kg/m General: Well developed, well nourished, no apparent distress, normal affect, normal gait with no antalgic component.  HEENT: Head normocephalic, atraumatic, PERRL.  Abdomen: Soft, non tender, non distended, Bowel sounds present.  Heart: Examination of the heart reveals regular, rate, and rhythm. There is no murmur noted on ascultation. There is a normal apical pulse.  Lungs: Lungs are clear to auscultation. There is no wheeze, rhonchi, or crackles. There is normal expansion of bilateral chest walls.  Right knee: Examination of the right knee shows 0 to 125 degrees range of motion. Patient with varus alignment. Slight medial pseudolaxity. Patella tracks well. No swelling or edema throughout the right lower extremity. No effusion. No warmth or redness. Hip moves well with internal ex rotation with no discomfort.  X-rays of the right knee reviewed by me today from 10/24/2022 show complete loss of joint space in  the medial compartment of the right knee with varus deformity. Large patellofemoral osteophytes along the patella  and medial femoral condyle along the trochlea  Impression: Primary osteoarthritis of right knee [M17.11] Primary osteoarthritis of right knee (primary encounter diagnosis)  Plan: 7. 58 year old with advanced right knee degenerative arthritis with complete loss of joint space in the medial compartment of the right knee. He said years of increasing pain that has interfered to the point of affecting his quality of life and activities of daily living despite conservative treatment consisting of Tylenol, NSAIDs, cortisone injections and gel injections. Risks, benefits, complications of a right total knee arthroplasty have been discussed with the patient. Patient has agreed and consented procedure with Dr. Ernest Pine on 01/23/2023.  This note was generated in part with voice recognition software and I apologize for any typographical errors that were not detected and corrected.  [name] MPA-C   Electronically signed by [name] Musca, PA at 01/19/2023 1:32 PM EST

## 2023-01-23 NOTE — Progress Notes (Signed)
Patient is not able to walk the distance required to go the bathroom, or he/she is unable to safely negotiate stairs required to access the bathroom.  A 3in1 BSC will alleviate this problem   Liliann File P. Brighton Pilley, Jr. M.D.  

## 2023-01-23 NOTE — Transfer of Care (Signed)
Immediate Anesthesia Transfer of Care Note  Patient: [name]  Procedure(s) Performed: COMPUTER ASSISTED TOTAL KNEE ARTHROPLASTY (Right: Knee)  Patient Location: PACU  Anesthesia Type:General  Level of Consciousness: drowsy and patient cooperative  Airway & Oxygen Therapy: Patient Spontanous Breathing and Patient connected to face mask oxygen  Post-op Assessment: Report given to RN and Post -op Vital signs reviewed and stable  Post vital signs: Reviewed and stable  Last Vitals:  Vitals Value Taken Time  BP 99/59 01/23/23 1539  Temp    Pulse 88 01/23/23 1544  Resp 8 01/23/23 1544  SpO2 95 % 01/23/23 1544  Vitals shown include unfiled device data.  Last Pain:  Vitals:   01/23/23 0940  TempSrc: Temporal  PainSc: 4       Patients Stated Pain Goal: 0 (01/23/23 0940)  Complications: No notable events documented.

## 2023-01-23 NOTE — Plan of Care (Signed)
  Problem: Education: Goal: Knowledge of General Education information will improve Description: Including pain rating scale, medication(s)/side effects and non-pharmacologic comfort measures Outcome: Progressing   Problem: Clinical Measurements: Goal: Ability to maintain clinical measurements within normal limits will improve Outcome: Progressing   

## 2023-01-23 NOTE — Anesthesia Preprocedure Evaluation (Signed)
Anesthesia Evaluation  Patient identified by MRN, date of birth, ID band Patient awake    Reviewed: Allergy & Precautions, NPO status , Patient's Chart, lab work & pertinent test results  History of Anesthesia Complications Negative for: history of anesthetic complications  Airway Mallampati: II  TM Distance: >3 FB Neck ROM: Full    Dental no notable dental hx. (+) Teeth Intact   Pulmonary neg pulmonary ROS, neg sleep apnea, neg COPD, Patient abstained from smoking.Not current smoker   Pulmonary exam normal breath sounds clear to auscultation       Cardiovascular Exercise Tolerance: Good METShypertension, Pt. on medications (-) CAD and (-) Past MI (-) dysrhythmias  Rhythm:Regular Rate:Normal - Systolic murmurs    Neuro/Psych  Headaches  negative psych ROS   GI/Hepatic ,GERD  Medicated and Controlled,,(+)     (-) substance abuse    Endo/Other  diabetes, Well Controlled, Oral Hypoglycemic Agents    Renal/GU negative Renal ROS     Musculoskeletal  (+) Arthritis , Osteoarthritis,    Abdominal   Peds  Hematology   Anesthesia Other Findings Past Medical History: No date: Anemia HX DILATION : Chronic urethral stricture No date: Complication of anesthesia     Comment:  woke up during neck fusion No date: Degenerative arthritis of right knee No date: DM (diabetes mellitus), type 2 (HCC) No date: GERD (gastroesophageal reflux disease) No date: Headache, classical migraine No date: History of kidney stones POST SURG. 2008: History of pulmonary embolus (PE) No date: Hyperlipidemia No date: Hypertension No date: Hypospadias No date: Seasonal allergies  Reproductive/Obstetrics                             Anesthesia Physical Anesthesia Plan  ASA: 2  Anesthesia Plan: General   Post-op Pain Management: Ofirmev IV (intra-op)*, Celebrex PO (pre-op)* and Gabapentin PO (pre-op)*   Induction:  Intravenous  PONV Risk Score and Plan: 3 and Ondansetron, Dexamethasone and Midazolam  Airway Management Planned: Oral ETT and Video Laryngoscope Planned  Additional Equipment: None  Intra-op Plan:   Post-operative Plan: Extubation in OR  Informed Consent: I have reviewed the patients History and Physical, chart, labs and discussed the procedure including the risks, benefits and alternatives for the proposed anesthesia with the patient or authorized representative who has indicated his/her understanding and acceptance.     Dental advisory given  Plan Discussed with: CRNA and Surgeon  Anesthesia Plan Comments: (Discussed r/b/a of neuraxial anesthesia vs GETA. Patient has zero interest in getting a spinal; says he is deathly afraid of needles.  Discussed risks of anesthesia with patient, including PONV, sore throat, lip/dental/eye damage. Rare risks discussed as well, such as cardiorespiratory and neurological sequelae, and allergic reactions. Discussed the role of CRNA in patient's perioperative care. Patient understands.)       Anesthesia Quick Evaluation

## 2023-01-23 NOTE — Anesthesia Postprocedure Evaluation (Signed)
Anesthesia Post Note  Patient: [name]  Procedure(s) Performed: COMPUTER ASSISTED TOTAL KNEE ARTHROPLASTY (Right: Knee)  Patient location during evaluation: PACU Anesthesia Type: General Level of consciousness: awake and alert, oriented and patient cooperative Pain management: pain level controlled Vital Signs Assessment: post-procedure vital signs reviewed and stable Respiratory status: spontaneous breathing, nonlabored ventilation and respiratory function stable Cardiovascular status: blood pressure returned to baseline and stable Postop Assessment: adequate PO intake Anesthetic complications: no   No notable events documented.   Last Vitals:  Vitals:   01/23/23 1625 01/23/23 1630  BP:    Pulse: 93 94  Resp: 11 11  Temp:    SpO2: 97% 94%    Last Pain:  Vitals:   01/23/23 1630  TempSrc:   PainSc: 0-No pain                 Reed Breech

## 2023-01-24 ENCOUNTER — Other Ambulatory Visit (HOSPITAL_COMMUNITY): Payer: Self-pay

## 2023-01-24 DIAGNOSIS — M1711 Unilateral primary osteoarthritis, right knee: Secondary | ICD-10-CM | POA: Diagnosis not present

## 2023-01-24 LAB — GLUCOSE, CAPILLARY: Glucose-Capillary: 273 mg/dL — ABNORMAL HIGH (ref 70–99)

## 2023-01-24 MED ORDER — ASPIRIN 81 MG PO CHEW: 81.0000 mg | CHEWABLE_TABLET | Freq: Two times a day (BID) | ORAL | Status: AC

## 2023-01-24 MED ORDER — OXYCODONE HCL 5 MG PO TABS: 5.0000 mg | ORAL_TABLET | ORAL | 0 refills | Status: AC | PRN

## 2023-01-24 MED ORDER — TRAMADOL HCL 50 MG PO TABS: 50.0000 mg | ORAL_TABLET | Freq: Four times a day (QID) | ORAL | 1 refills | Status: AC | PRN

## 2023-01-24 NOTE — Progress Notes (Signed)
Bone foam in place and tolerated for 30 minutes.

## 2023-01-24 NOTE — Discharge Summary (Signed)
Physician Discharge Summary  Subjective: 1 Day Post-Op Procedure(s) (LRB): COMPUTER ASSISTED TOTAL KNEE ARTHROPLASTY (Right) Patient reports pain as mild.   Patient seen in rounds with Dr. Audelia Acton. Patient is well, and has had no acute complaints or problems Patient is ready to go home with home health physical therapy  Physician Discharge Summary  Patient ID: [name] MRN: [id number] DOB/AGE: 59-Feb-1967 59 y.o.  Admit date: 01/23/2023 Discharge date: 01/24/2023  Admission Diagnoses:  Discharge Diagnoses:  Principal Problem:   History of total knee arthroplasty, right   Discharged Condition: fair  Hospital Course: The patient is postop day 1 from a right total knee arthroplasty.  The patient has vitals that are stable.  His pain is manageable.  His Hemovac tubing was removed with no complication this morning.  He will do for therapy today and we will plan on discharging home with home health physical therapy.  Treatments: surgery:   Right total knee arthroplasty using computer-assisted navigation   SURGEON:  Jena Gauss. M.D.   ASSISTANT:  Gean Birchwood, PA-C (present and scrubbed throughout the case, critical for assistance with exposure, retraction, instrumentation, and closure)   ANESTHESIA: general   ESTIMATED BLOOD LOSS: 50 mL   FLUIDS REPLACED: 700 mL of crystalloid   TOURNIQUET TIME: 86 minutes   DRAINS: 2 medium Hemovac drains   SOFT TISSUE RELEASES: Anterior cruciate ligament, posterior cruciate ligament, deep and superficial medial collateral ligament, patellofemoral ligament   IMPLANTS UTILIZED: DePuy Attune size 5 posterior stabilized femoral component (cemented), size 5 rotating platform tibial component (cemented), 38 mm medialized dome patella (cemented), and a 5 mm stabilized rotating platform polyethylene insert.    Discharge Exam: Blood pressure (!) 132/94, pulse 94, temperature 98 F (36.7 C), resp. rate 16, height 5\' 11"  (1.803 m),  weight 81.6 kg, SpO2 92%.   Disposition: Discharge disposition: 01-Home or Self Care        Allergies as of 01/24/2023   No Known Allergies      Medication List     STOP taking these medications    aspirin 81 MG tablet Replaced by: aspirin 81 MG chewable tablet       TAKE these medications    Alka-Seltzer Sinus Alrgy Cough 5-6.25-10-325 MG Caps Generic drug: Phenyleph-Doxylamine-DM-APAP Take 2 capsules by mouth as needed (allergies).   aspirin 81 MG chewable tablet Chew 1 tablet (81 mg total) by mouth 2 (two) times daily. Replaces: aspirin 81 MG tablet   atorvastatin 40 MG tablet Commonly known as: LIPITOR Take 1 tablet by mouth every morning.   famotidine 20 MG tablet Commonly known as: PEPCID Take 20 mg by mouth every morning.   glipiZIDE 10 MG tablet Commonly known as: GLUCOTROL Take 10 mg by mouth 2 (two) times daily before a meal.   meloxicam 15 MG tablet Commonly known as: MOBIC Take 15 mg by mouth daily.   metFORMIN 500 MG tablet Commonly known as: GLUCOPHAGE TAKE 1 TABLET BY MOUTH TWICE A DAY WITH A MEAL   oxyCODONE 5 MG immediate release tablet Commonly known as: Oxy IR/ROXICODONE Take 1 tablet (5 mg total) by mouth every 4 (four) hours as needed for moderate pain (pain score 4-6) (pain score 4-6).   oxymetazoline 0.05 % nasal spray Commonly known as: AFRIN Place 1 spray into both nostrils daily as needed for congestion. No drip nasal mist   rizatriptan 10 MG tablet Commonly known as: MAXALT TAKE 1 TABLET BY MOUTH AS DIRECTED. MAY REPEAT IN 2 HOURS IF  NEEDED FOR MIGRAINEHEADACHES What changed: See the new instructions.   SUMAtriptan 50 MG tablet Commonly known as: IMITREX TAKE 1 TABLET BY MOUTH EVERY 2 HOURS AS NEEDED FOR MIGRAINE AS DIRECTED   tamsulosin 0.4 MG Caps capsule Commonly known as: FLOMAX Take 0.4 mg by mouth daily.   traMADol 50 MG tablet Commonly known as: ULTRAM Take 1-2 tablets (50-100 mg total) by mouth every 6  (six) hours as needed for moderate pain (pain score 4-6).   Voltaren 1 % Gel Generic drug: diclofenac Sodium Apply 2 g topically daily as needed (pain).               Durable Medical Equipment  (From admission, onward)           Start     Ordered   01/23/23 1848  DME Walker rolling  Once       Question:  Patient needs a walker to treat with the following condition  Answer:  Total knee replacement status   01/23/23 1847   01/23/23 1848  DME Bedside commode  Once       Comments: Patient is not able to walk the distance required to go the bathroom, or he/she is unable to safely negotiate stairs required to access the bathroom.  A 3in1 BSC will alleviate this problem  Question:  Patient needs a bedside commode to treat with the following condition  Answer:  Total knee replacement status   01/23/23 1847            Follow-up Information     Rayburn Go, PA-C Follow up on 02/05/2023.   Specialty: Orthopedic Surgery Why: at 9:15am Contact information: [address] [place] [address] [phone]         Donato Heinz, MD Follow up on 03/12/2023.   Specialty: Orthopedic Surgery Why: at 2:15pm Contact information: [address] [facility] [place] [address] [phone]                 Signed: Lenard Forth, Galen Malkowski 01/24/2023, 7:54 AM   Objective: Vital signs in last 24 hours: Temp:  [96.5 F (35.8 C)-98.7 F (37.1 C)] 98 F (36.7 C) (01/18 0335) Pulse Rate:  [86-112] 94 (01/18 0335) Resp:  [8-23] 16 (01/18 0335) BP: (91-137)/(59-101) 132/94 (01/18 0335) SpO2:  [90 %-99 %] 92 % (01/18 0335) Weight:  [81.6 kg] 81.6 kg (01/17 0940)  Intake/Output from previous day:  Intake/Output Summary (Last 24 hours) at 01/24/2023 0754 Last data filed at 01/24/2023 0500 Gross per 24 hour  Intake 1720 ml  Output 1600 ml  Net 120 ml    Intake/Output this shift: No intake/output data recorded.  Labs: No results for input(s):  "HGB" in the last 72 hours. No results for input(s): "WBC", "RBC", "HCT", "PLT" in the last 72 hours. No results for input(s): "NA", "K", "CL", "CO2", "BUN", "CREATININE", "GLUCOSE", "CALCIUM" in the last 72 hours. No results for input(s): "LABPT", "INR" in the last 72 hours.  EXAM: General - Patient is Alert and Oriented Extremity - Neurovascular intact Sensation intact distally Dorsiflexion/Plantar flexion intact Compartment soft Incision - clean, dry, with the Hemovac tubing removed with no complication Motor Function -plantarflexion and dorsiflexion intact.  Able to straight leg raise independently.  Assessment/Plan: 1 Day Post-Op Procedure(s) (LRB): COMPUTER ASSISTED TOTAL KNEE ARTHROPLASTY (Right) Procedure(s) (LRB): COMPUTER ASSISTED TOTAL KNEE ARTHROPLASTY (Right) Past Medical History:  Diagnosis Date   Anemia    Chronic urethral stricture HX DILATION    Complication of  anesthesia    woke up during neck fusion   Degenerative arthritis of right knee    DM (diabetes mellitus), type 2 (HCC)    GERD (gastroesophageal reflux disease)    Headache, classical migraine    History of kidney stones    History of pulmonary embolus (PE) POST SURG. 2008   Hyperlipidemia    Hypertension    Hypospadias    Seasonal allergies    Principal Problem:   History of total knee arthroplasty, right  Estimated body mass index is 25.1 kg/m as calculated from the following:   Height as of this encounter: 5\' 11"  (1.803 m).   Weight as of this encounter: 81.6 kg. Advance diet Up with therapy D/C IV fluids Discharge home with home health Diet - Regular diet Follow up - in 2 weeks Activity - WBAT Disposition - Home Condition Upon Discharge - Stable DVT Prophylaxis - Aspirin and TED hose  Dedra Skeens, PA-C Orthopaedic Surgery 01/24/2023, 7:54 AM

## 2023-01-24 NOTE — TOC Transition Note (Signed)
Transition of Care [facility]) - Discharge Note   Patient Details  Name: JUDSON MAZOR MRN: [id number] Date of Birth: 1965-03-16  [facility] CM/SW Contact:  Liliana Cline, LCSW Phone Number: 01/24/2023, 11:47 AM   Clinical Narrative:    Patient discharged home today. Notified Katina with [facility] - this was prearranged by the Surgeon's Office. Patient has a RW at home. Confirmed with Surgeon and PA no other DME needs.   Final next level of care: Home w Home Health Services     Patient Goals and CMS Choice            Discharge Placement                       Discharge Plan and Services Additional resources added to the After Visit Summary for                              [facility] Agency: [facility] Date [facility] Agency Contacted: 01/24/23   Representative spoke with at [facility] Agency: Laurelyn Sickle  Social Drivers of Health (SDOH) Interventions SDOH Screenings   Food Insecurity: No Food Insecurity (01/23/2023)  Housing: Low Risk  (01/23/2023)  Transportation Needs: No Transportation Needs (01/23/2023)  Utilities: Not At Risk (01/23/2023)  Tobacco Use: Medium Risk (01/23/2023)     Readmission Risk Interventions     No data to display

## 2023-01-24 NOTE — Progress Notes (Signed)
Bone foam in place and tolerated to 30 minutes.

## 2023-01-24 NOTE — Progress Notes (Signed)
  Subjective: 1 Day Post-Op Procedure(s) (LRB): COMPUTER ASSISTED TOTAL KNEE ARTHROPLASTY (Right) Patient reports pain as mild.   Patient is well, and has had no acute complaints or problems Plan is to go Home after hospital stay. Negative for chest pain and shortness of breath Fever: no Gastrointestinal: Negative for nausea and vomiting  Objective: Vital signs in last 24 hours: Temp:  [96.5 F (35.8 C)-98.7 F (37.1 C)] 98 F (36.7 C) (01/18 0335) Pulse Rate:  [86-112] 94 (01/18 0335) Resp:  [8-23] 16 (01/18 0335) BP: (91-137)/(59-101) 132/94 (01/18 0335) SpO2:  [90 %-99 %] 92 % (01/18 0335) Weight:  [81.6 kg] 81.6 kg (01/17 0940)  Intake/Output from previous day:  Intake/Output Summary (Last 24 hours) at 01/24/2023 0751 Last data filed at 01/24/2023 0500 Gross per 24 hour  Intake 1720 ml  Output 1600 ml  Net 120 ml    Intake/Output this shift: No intake/output data recorded.  Labs: No results for input(s): "HGB" in the last 72 hours. No results for input(s): "WBC", "RBC", "HCT", "PLT" in the last 72 hours. No results for input(s): "NA", "K", "CL", "CO2", "BUN", "CREATININE", "GLUCOSE", "CALCIUM" in the last 72 hours. No results for input(s): "LABPT", "INR" in the last 72 hours.   EXAM General - Patient is Alert and Oriented Extremity - Neurovascular intact Sensation intact distally Dorsiflexion/Plantar flexion intact Compartment soft Dressing/Incision - clean, dry, with a Hemovac tubing removed with no complication.  The tubing was intact on removal. Motor Function - intact, moving foot and toes well on exam.  Able to do a straight leg raise independently  Past Medical History:  Diagnosis Date   Anemia    Chronic urethral stricture HX DILATION    Complication of anesthesia    woke up during neck fusion   Degenerative arthritis of right knee    DM (diabetes mellitus), type 2 (HCC)    GERD (gastroesophageal reflux disease)    Headache, classical migraine     History of kidney stones    History of pulmonary embolus (PE) POST SURG. 2008   Hyperlipidemia    Hypertension    Hypospadias    Seasonal allergies     Assessment/Plan: 1 Day Post-Op Procedure(s) (LRB): COMPUTER ASSISTED TOTAL KNEE ARTHROPLASTY (Right) Principal Problem:   History of total knee arthroplasty, right  Estimated body mass index is 25.1 kg/m as calculated from the following:   Height as of this encounter: 5\' 11"  (1.803 m).   Weight as of this encounter: 81.6 kg. Advance diet Up with therapy D/C IV fluids Discharge home with home health  DVT Prophylaxis - Aspirin and TED hose Weight-Bearing as tolerated to right leg  Dedra Skeens, PA-C Orthopaedic Surgery 01/24/2023, 7:51 AM

## 2023-01-24 NOTE — Evaluation (Signed)
Physical Therapy Evaluation Patient Details Name: [name] MRN: [id number] DOB: Jun 28, 1965 Today's Date: 01/24/2023  History of Present Illness  [name] is a 58 y.o. male who presents today for history and physical for right total knee arthroplasty with Dr. Ernest Pine on 01/23/2023. Patient has had several years of right knee pain mostly along the medial joint line. Pain has been increasing and becoming so severe that it is interfering with his quality of life and activities of daily living. Patient has had increasing swelling of the right knee. No instability. Pain is increased with going up and down stairs, ladders, standing and walking. He is unable to walk long distances. Patient has tried Tylenol, NSAIDs, cortisone injections, gel injections as well as activity modification with no improvement. Patient has seen Dr. Ernest Pine, discussed total knee arthroplasty and agreed exam procedure. He is post op day #1, WBAT in RLE.  Clinical Impression  58 yo Male s/p RLE TKA on 01/23/2023. Patient was independent in all mobility prior to admittance. He lives at home with his wife in a 2 story home (able to live on the main floor) with steps to enter. Patient denies any recent falls. He reports minimal pain in right knee at start of evaluation. Patient is modified independent in supine to sit bed mobility. He is motivated and demonstrates good LE management. RLE Knee ROM in supine: 5-115 degrees. Strength testing, RLE: hip grossly 4/5, knee not formally tested, ankle 5/5; LLE strength is [facility]; Patient was able to transfer sit to stand with contact guard assist, progressing to supervision using RW for safety. He was educated on proper positioning, including advancing RLE forward as needed for pain control. Patient ambulated in room and hallway x140 feet total with multiple rest breaks for education and mobility. He was educated on safe stair negotiation and demonstrated good awareness negotiating 4 steps with B rail  assist, non-reciprocal with CGA. Patient was provided with total knee replacement handout and educated on home exercises to facilitate strength and ROM. He was educated on proper positioning including to avoid resting with knee in flexed position as well as pain management techniques. Patient demonstrates good awareness and has good support at home from his wife. He would benefit from additional skilled PT Intervention to improve strength, ROM and mobility.       If plan is discharge home, recommend the following: A little help with walking and/or transfers;A little help with bathing/dressing/bathroom;Assistance with cooking/housework;Assist for transportation;Help with stairs or ramp for entrance   Can travel by private vehicle        Equipment Recommendations None recommended by PT  Recommendations for Other Services       Functional Status Assessment Patient has had a recent decline in their functional status and demonstrates the ability to make significant improvements in function in a reasonable and predictable amount of time.     Precautions / Restrictions Precautions Precautions: Fall Precaution Comments: no recent falls in last 6 months Restrictions Weight Bearing Restrictions Per Provider Order: Yes RLE Weight Bearing Per Provider Order: Weight bearing as tolerated      Mobility  Bed Mobility Overal bed mobility: Modified Independent             General bed mobility comments: able to transition supine to sit without physical assistance, elevated head of bed;    Transfers Overall transfer level: Needs assistance Equipment used: Rolling walker (2 wheels) Transfers: Sit to/from Stand Sit to Stand: Contact guard assist, Supervision  General transfer comment: pt reports only mild dizziness, but demos good safety with slow positional changes; Initially required CGA for safety, but able to progress to supervision; educated on advancing RLE to reduce  discomfort as needed    Ambulation/Gait Ambulation/Gait assistance: Supervision Gait Distance (Feet): 140 Feet (with rest breaks) Assistive device: Rolling walker (2 wheels) Gait Pattern/deviations: Step-to pattern, Step-through pattern, Decreased step length - left, Decreased stance time - right Gait velocity: slow     General Gait Details: Pt ambulated in room x20 feet x3 reps, in hallway/PT gym x40 feet x2; He exhibits guarding in BUE for less weight acceptance on RLE; able to demonstrate good knee extension on RLE at initial contact with good quad control; Required cues for pushing RW rather than picking up for better energy conservation and balance.  Stairs Stairs: Yes Stairs assistance: Contact guard assist Stair Management: Two rails, Step to pattern Number of Stairs: 4 General stair comments: Educated on proper mechanics for stair negotiation for safety; Pt/wife verbalized and demonstrated understanding  Wheelchair Mobility     Tilt Bed    Modified Rankin (Stroke Patients Only)       Balance Overall balance assessment: Needs assistance Sitting-balance support: No upper extremity supported, Feet supported Sitting balance-Leahy Scale: Good Sitting balance - Comments: reaches below knees without difficulty to managing LB clothing   Standing balance support: Bilateral upper extremity supported, During functional activity Standing balance-Leahy Scale: Fair Standing balance comment: Able to help hike underwear in standing with min guard and no LOB; able to stand unsupported during equipment management without loss of balance;                             Pertinent Vitals/Pain Pain Assessment Pain Assessment: 0-10 Pain Score: 6  Pain Location: Initially 2-3/10 pain in right knee however after walking and moving, pain increased to 5-6/10 Pain Descriptors / Indicators: Aching, Sore, Tightness Pain Intervention(s): Limited activity within patient's tolerance,  Monitored during session, Repositioned, Patient requesting pain meds-RN notified, Ice applied    Home Living Family/patient expects to be discharged to:: Private residence Living Arrangements: Spouse/significant other   Type of Home: House Home Access: Stairs to enter Entrance Stairs-Rails: Right;Left;Can reach both Entrance Stairs-Number of Steps: 4 Alternate Level Stairs-Number of Steps: will be staying on main floor when he returns home Home Layout: Two level;Able to live on main level with bedroom/bathroom Home Equipment: Grab bars - tub/shower;Rolling Walker (2 wheels);Cane - single point      Prior Function Prior Level of Function : Independent/Modified Independent             Mobility Comments: was working and driving prior to admittance       Extremity/Trunk Assessment   Upper Extremity Assessment Upper Extremity Assessment: Overall WFL for tasks assessed    Lower Extremity Assessment Lower Extremity Assessment: Overall WFL for tasks assessed;RLE deficits/detail RLE Deficits / Details: s/p R TKA; ROM: 5-115 in supine RLE Sensation: decreased light touch (some numbness reported along knee/thigh) RLE Coordination: WNL    Cervical / Trunk Assessment Cervical / Trunk Assessment: Normal  Communication   Communication Communication: No apparent difficulties  Cognition Arousal: Alert Behavior During Therapy: WFL for tasks assessed/performed Overall Cognitive Status: Within Functional Limits for tasks assessed  General Comments General comments (skin integrity, edema, etc.): no swelling noted in RLE foot;    Exercises Total Joint Exercises Quad Sets: AROM, Right, 5 reps, Supine Heel Slides: AROM, Right, 10 reps, Supine Straight Leg Raises: AROM, Right, 10 reps, Supine Goniometric ROM: supine, RLE: 5-115 Other Exercises Other Exercises: Provided patient with total knee replacement PT home exercise  worksheet; Instructed patient in exercise with patient demonstrating and verbalizing understanding; Other Exercises: Educated patient in transfer safety, safe positioning including to avoid resting with knee in flexed position, safe equipment use, pain management including ice, elevation and pain meds as prescribed; Patient and wife verbalize understanding; He is scheduled to begin outpatient PT on Monday 01/26/23   Assessment/Plan    PT Assessment Patient needs continued PT services  PT Problem List Decreased strength;Pain;Decreased range of motion;Decreased activity tolerance;Decreased balance;Decreased mobility       PT Treatment Interventions DME instruction;Balance training;Gait training;Stair training;Functional mobility training;Therapeutic activities;Therapeutic exercise;Patient/family education    PT Goals (Current goals can be found in the Care Plan section)  Acute Rehab PT Goals Patient Stated Goal: to go home PT Goal Formulation: With patient Time For Goal Achievement: 02/07/23 Potential to Achieve Goals: Good    Frequency BID     Co-evaluation   Reason for Co-Treatment: To address functional/ADL transfers PT goals addressed during session: Mobility/safety with mobility OT goals addressed during session: ADL's and self-care       AM-PAC PT "6 Clicks" Mobility  Outcome Measure Help needed turning from your back to your side while in a flat bed without using bedrails?: None Help needed moving from lying on your back to sitting on the side of a flat bed without using bedrails?: None Help needed moving to and from a bed to a chair (including a wheelchair)?: A Little Help needed standing up from a chair using your arms (e.g., wheelchair or bedside chair)?: A Little Help needed to walk in hospital room?: A Little Help needed climbing 3-5 steps with a railing? : A Little 6 Click Score: 20    End of Session Equipment Utilized During Treatment: Gait belt Activity  Tolerance: Patient tolerated treatment well;Patient limited by pain Patient left: in chair;with call bell/phone within reach;with family/visitor present Nurse Communication: Mobility status;Patient requests pain meds PT Visit Diagnosis: Pain;Other abnormalities of gait and mobility (R26.89);Muscle weakness (generalized) (M62.81) Pain - Right/Left: Right Pain - part of body: Knee    Time: 0833-0920 PT Time Calculation (min) (ACUTE ONLY): 47 min   Charges:   PT Evaluation $PT Eval Low Complexity: 1 Low PT Treatments $Gait Training: 8-22 mins $Therapeutic Exercise: 8-22 mins PT General Charges $$ ACUTE PT VISIT: 1 Visit          Delmont Prosch PT, DPT 01/24/2023, 9:47 AM

## 2023-01-24 NOTE — Evaluation (Signed)
Occupational Therapy Evaluation Patient Details Name: [name] MRN: [id number] DOB: 1965-11-04 Today's Date: 01/24/2023   History of Present Illness [name] is a 58 y.o. male who presents today for history and physical for right total knee arthroplasty with Dr. Ernest Pine on 01/23/2023. Patient has had several years of right knee pain mostly along the medial joint line. Pain has been increasing and becoming so severe that it is interfering with his quality of life and activities of daily living. Patient has had increasing swelling of the right knee. No instability. Pain is increased with going up and down stairs, ladders, standing and walking. He is unable to walk long distances. Patient has tried Tylenol, NSAIDs, cortisone injections, gel injections as well as activity modification with no improvement. Patient has seen Dr. Ernest Pine, discussed total knee arthroplasty and agreed exam procedure.   Clinical Impression   Pt seen this date for OT evaluation.  PT and spouse present at time of OT arrival and agreeable to participation in OT eval.   Ambulatory to bathroom with RW and min guard. All ADLs currently supv-min A. Anticipate at least min A for LB ADLs as pain increases as numbness wears off. Spouse available and planning to assist as needed with ADLs upon d/c.  AE needs assessed.  None recommended by OT.  Pt denies need for 3in1 and demonstrated good ability to manage toilet transfer without use of grab bar this date.  Spouse feeling confident to assist pt as needed as she has been through TKA x2 herself.  No additional skilled OT indicated at this time.  Planning to d/c home with [facility].      If plan is discharge home, recommend the following: A little help with walking and/or transfers;A little help with bathing/dressing/bathroom;Assistance with cooking/housework;Assist for transportation;Help with stairs or ramp for entrance    Functional Status Assessment  Patient has had a recent decline  in their functional status and demonstrates the ability to make significant improvements in function in a reasonable and predictable amount of time.  Equipment Recommendations  None recommended by OT    Recommendations for Other Services       Precautions / Restrictions Precautions Precautions: Fall Restrictions Weight Bearing Restrictions Per Provider Order: Yes RLE Weight Bearing Per Provider Order: Weight bearing as tolerated      Mobility Bed Mobility               General bed mobility comments: completed with PT prior to OT arrival (see PT note for details) Patient Response: Cooperative  Transfers Overall transfer level: Needs assistance Equipment used: Rolling walker (2 wheels) Transfers: Sit to/from Stand Sit to Stand: Contact guard assist           General transfer comment: pt reports only mild dizziness, but demos good safety with slow positional changes      Balance Overall balance assessment: Needs assistance Sitting-balance support: No upper extremity supported, Feet supported Sitting balance-Leahy Scale: Good Sitting balance - Comments: reaches below knees without difficulty to managing LB clothing   Standing balance support: Bilateral upper extremity supported, During functional activity Standing balance-Leahy Scale: Fair Standing balance comment: Able to help hike underwear in standing with min guard and no LOB                           ADL either performed or assessed with clinical judgement   ADL Overall ADL's : Needs assistance/impaired  Toilet Transfer: Radiographer, therapeutic Details (indicate cue type and reason): able to manage without grab bars, UE support on toilet seat while descending and ascending from commode         Functional mobility during ADLs: Supervision/safety;Contact guard assist General ADL Comments: Ambulatory to bathroom with RW and min guard.  All ADLs  currently supv-min A.  Anticipate at least min A for LB ADLs as pain increases as numbness wears off.  Spouse available and planning to assist as needed upon d/c.     Vision Patient Visual Report: No change from baseline                         Pertinent Vitals/Pain Pain Assessment Pain Assessment: 0-10 Pain Score: 3  Pain Location: Pt reports 2-3, though still some numbness post sx Pain Descriptors / Indicators: Aching Pain Intervention(s): Limited activity within patient's tolerance, Monitored during session, Repositioned     Extremity/Trunk Assessment Upper Extremity Assessment Upper Extremity Assessment: Overall WFL for tasks assessed   Lower Extremity Assessment Lower Extremity Assessment: Defer to PT evaluation;RLE deficits/detail RLE Deficits / Details: s/p R TKA   Cervical / Trunk Assessment Cervical / Trunk Assessment: Normal   Communication Communication Communication: No apparent difficulties   Cognition Arousal: Alert Behavior During Therapy: WFL for tasks assessed/performed                                         General Comments       Exercises Other Exercises Other Exercises: OT role, goals, poc   Shoulder Instructions      Home Living Family/patient expects to be discharged to:: Private residence Living Arrangements: Spouse/significant other   Type of Home: House             Bathroom Shower/Tub: Chief Strategy Officer: Standard     Home Equipment: Grab bars - tub/shower          Prior Functioning/Environment Prior Level of Function : Independent/Modified Independent                        OT Problem List: Impaired balance (sitting and/or standing);Pain      OT Treatment/Interventions:      OT Goals(Current goals can be found in the care plan section) Acute Rehab OT Goals Patient Stated Goal: Home with spouse OT Goal Formulation: With patient/family Time For Goal Achievement:  02/07/23 Potential to Achieve Goals: Good ADL Goals Pt Will Transfer to Toilet: with contact guard assist;ambulating;regular height toilet  OT Frequency:      Co-evaluation PT/OT/SLP Co-Evaluation/Treatment: Yes Reason for Co-Treatment: To address functional/ADL transfers PT goals addressed during session: Mobility/safety with mobility OT goals addressed during session: ADL's and self-care      AM-PAC OT "6 Clicks" Daily Activity     Outcome Measure Help from another person eating meals?: None Help from another person taking care of personal grooming?: None Help from another person toileting, which includes using toliet, bedpan, or urinal?: A Little Help from another person bathing (including washing, rinsing, drying)?: A Little Help from another person to put on and taking off regular upper body clothing?: None Help from another person to put on and taking off regular lower body clothing?: A Little 6 Click Score: 21   End of Session Equipment Utilized During Treatment: Gait belt;Rolling walker (2  wheels)  Activity Tolerance: Patient tolerated treatment well Patient left: Other (comment) (with PT to complete stair training)  OT Visit Diagnosis: Other abnormalities of gait and mobility (R26.89)                Time: 0981-1914 OT Time Calculation (min): 9 min Charges:  OT General Charges $OT Visit: 1 Visit OT Evaluation $OT Eval Low Complexity: 1 Low Danelle Earthly, MS, OTR/L [place] Dials 01/24/2023, 9:18 AM

## 2023-01-26 ENCOUNTER — Encounter: Payer: Self-pay | Admitting: Orthopedic Surgery

## 2023-01-26 LAB — GLUCOSE, CAPILLARY: Glucose-Capillary: 297 mg/dL — ABNORMAL HIGH (ref 70–99)

## 2023-04-03 ENCOUNTER — Telehealth: Payer: Self-pay | Admitting: Family Medicine

## 2023-04-03 NOTE — Telephone Encounter (Signed)
 We have not been able to speak with patient to schedule appointment. We left message to call office on 03/27/23 and 04/02/23 to schedule appointment.

## 2023-05-26 ENCOUNTER — Encounter (INDEPENDENT_AMBULATORY_CARE_PROVIDER_SITE_OTHER): Payer: Self-pay

## 2023-07-03 ENCOUNTER — Other Ambulatory Visit
Admission: RE | Admit: 2023-07-03 | Discharge: 2023-07-03 | Disposition: A | Discharge status: Home / Self Care | Source: Ambulatory Visit | Attending: Sports Medicine | Admitting: Sports Medicine

## 2023-07-03 DIAGNOSIS — M25461 Effusion, right knee: Secondary | ICD-10-CM | POA: Insufficient documentation

## 2023-07-03 LAB — SYNOVIAL CELL COUNT + DIFF, W/ CRYSTALS
Crystals, Fluid: NONE SEEN
Eosinophils-Synovial: 0 %
Lymphocytes-Synovial Fld: 72 %
Monocyte-Macrophage-Synovial Fluid: 26 %
Neutrophil, Synovial: 2 %
WBC, Synovial: 164 /mm3 (ref 0–200)

## 2023-08-10 ENCOUNTER — Other Ambulatory Visit: Payer: Self-pay | Admitting: Orthopedic Surgery

## 2023-08-10 DIAGNOSIS — Z96651 Presence of right artificial knee joint: Secondary | ICD-10-CM

## 2023-08-10 DIAGNOSIS — M25461 Effusion, right knee: Secondary | ICD-10-CM

## 2023-08-13 ENCOUNTER — Ambulatory Visit
Admission: RE | Admit: 2023-08-13 | Discharge: 2023-08-13 | Disposition: A | Discharge status: Home / Self Care | Source: Ambulatory Visit | Attending: Orthopedic Surgery | Admitting: Orthopedic Surgery

## 2023-08-13 DIAGNOSIS — M25461 Effusion, right knee: Secondary | ICD-10-CM | POA: Diagnosis present

## 2023-08-13 DIAGNOSIS — M25561 Pain in right knee: Secondary | ICD-10-CM | POA: Insufficient documentation

## 2023-08-13 DIAGNOSIS — Z96651 Presence of right artificial knee joint: Secondary | ICD-10-CM | POA: Diagnosis present

## 2023-08-18 ENCOUNTER — Telehealth: Payer: Self-pay | Admitting: Orthopaedic Surgery

## 2023-08-18 NOTE — Telephone Encounter (Signed)
 Pt called stating he would like a second opinion. Pt had surgery with Dr Hooten for total knee and is unhappy with his IT trainer. Pt state he has had complications and had a MRI with them and just unhappy. Pt is asking to be seen by Vernetta. Informed pt that I need to make sure it's ok to schedule. Please call pt or lewt me know it's ok to schedule with [facility]. Pt was seen by [facility]. Pt phone number is [phone].

## 2023-08-20 NOTE — Telephone Encounter (Signed)
 Called pt 1X and left vm for pt to set appt with Dr Vernetta 1st available appt for second opinion  of total right knee per Vernetta.

## 2023-08-28 ENCOUNTER — Other Ambulatory Visit
Admission: RE | Admit: 2023-08-28 | Discharge: 2023-08-28 | Disposition: A | Discharge status: Home / Self Care | Source: Ambulatory Visit | Attending: Sports Medicine | Admitting: Sports Medicine

## 2023-08-28 DIAGNOSIS — M25461 Effusion, right knee: Secondary | ICD-10-CM | POA: Diagnosis present

## 2023-08-28 LAB — SYNOVIAL CELL COUNT + DIFF, W/ CRYSTALS
Crystals, Fluid: NONE SEEN
Eosinophils-Synovial: 0 % (ref 0–1)
Lymphocytes-Synovial Fld: 57 % — ABNORMAL HIGH (ref 0–20)
Monocyte-Macrophage-Synovial Fluid: 37 % — ABNORMAL LOW (ref 50–90)
Neutrophil, Synovial: 6 % (ref 0–25)
WBC, Synovial: 39 /mm3 (ref 0–200)

## 2023-11-09 ENCOUNTER — Encounter: Payer: Self-pay | Admitting: Radiology

## 2023-12-09 ENCOUNTER — Ambulatory Visit: Admitting: Orthopaedic Surgery

## 2023-12-10 ENCOUNTER — Ambulatory Visit: Admitting: Orthopaedic Surgery

## 2023-12-10 ENCOUNTER — Other Ambulatory Visit: Payer: Self-pay

## 2023-12-10 DIAGNOSIS — Z96651 Presence of right artificial knee joint: Secondary | ICD-10-CM

## 2023-12-10 NOTE — Progress Notes (Signed)
 The patient is an active 58 year old gentleman who is 11 months status post a right total knee arthroplasty that was performed in [place].  He is still been dealing with knee pain and swelling over the last 11 months and he says it is really just as painful as it was before surgery.  He said he has had aspirations twice of that knee and it was sent off with that showing no evidence of any type of infection.  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  He is here today for second opinion.  Examination of his left knee today at 11 months postop still shows a moderate knee joint effusion.  He has excellent flexion and extension of the knee and alignment it looks neutral and perfect.  On ligamentous exam of the right knee,  I do feel that there is plate in the knee with some laxity with a drawer sign and laxity with varus and valgus stressing with the knee at 30 degrees of flexion.  2 views of the right knee today shows a well-seated right total knee arthroplasty.  The components look perfectly aligned with no evidence of loosening or complicating features.  I am concerned that there may be some instability of this knee that would warrant a poly liner upsizing with a synovectomy.  I have told this that I have seen this in my own patients where they have continued to have swelling but no evidence of infection or implant failure but I may have missed judged the poly thickness and they got the range of motion back so well that there is some plate in the knee that is causing recurrent effusion and I had to go back to surgery for a polyliner upsizing.  I think this may be the case for his knee.  With his consent I did anesthetize the knee with lidocaine  and then withdrew just over 30 cc of fluid from his right knee that did not look worrisome for infection.  I did place a small steroid injection in his right knee joint to see if  this would decrease some of his synovitis given the fact that he has had no evidence of infection.  Will see him back in 2 weeks to see how he is doing overall and discussed the possibility of further surgery on his knee with a synovectomy and upsizing his poly liner.  I did show him a knee replacement model and described what I am seeing with his knee.  All questions and concerns were addressed and answered.

## 2023-12-21 ENCOUNTER — Encounter: Payer: Self-pay | Admitting: Orthopaedic Surgery

## 2023-12-21 ENCOUNTER — Ambulatory Visit: Admitting: Orthopaedic Surgery

## 2023-12-21 DIAGNOSIS — Z96651 Presence of right artificial knee joint: Secondary | ICD-10-CM

## 2023-12-21 NOTE — Progress Notes (Signed)
 The patient comes for continued follow-up as a relates to his painful right total knee arthroplasty that was performed in January of this year by Dr. Mardee in [place].  He came to us  for second opinion because he has had knee pain since surgery.  It seems like all of his pain is around the IT band and the lateral aspect of his right knee.  His x-rays were unremarkable and it does not look like the implants are oversized.  At his first visit with me I did recommend a steroid injection around this area in the knee he says that really did not improve any of his symptoms at all.  He has been through physical therapy.  He had a MRI of his right knee that was unremarkable.  I felt at his last visit there was a little bit of laxity in the knee itself.  On my exam today the knee is cool and there is no redness.  He has excellent range of motion of the knee and I did not feel like there was an instability of the knee on today's exam like I did before.  His pain is still consistent around the IT band.  I would like to send him to my partner Dr. Addie for even another opinion as a relates to the patient's painful right total knee arthroplasty and his IT band pain.  On to see if he has any recommendations for this patient.  The patient understands that I would not recommend surgery right now until the patient is been at least a year out from his initial total knee arthroplasty.  He does state that he has been hurting since surgery and is mainly this IT band area.  We will see what Dr. Brion opinion is about this as well.  The patient does agree with getting another opinion as well.

## 2024-01-22 ENCOUNTER — Ambulatory Visit: Admitting: Orthopedic Surgery

## 2024-02-05 ENCOUNTER — Other Ambulatory Visit (INDEPENDENT_AMBULATORY_CARE_PROVIDER_SITE_OTHER)

## 2024-02-05 ENCOUNTER — Encounter: Payer: Self-pay | Admitting: Orthopedic Surgery

## 2024-02-05 ENCOUNTER — Ambulatory Visit: Admitting: Orthopedic Surgery

## 2024-02-05 DIAGNOSIS — Z96651 Presence of right artificial knee joint: Secondary | ICD-10-CM

## 2024-02-05 NOTE — Progress Notes (Signed)
 "  Office Visit Note   Patient: [name]           Date of Birth: April 10, 1965           MRN: [id number] Visit Date: 02/05/2024 Requested by: Rudolpho Norleen BIRCH, MD [address] [facility] [address] [facility] [place],  [place] [address] PCP: Rudolpho Norleen BIRCH, MD  Subjective: Chief Complaint  Patient presents with   Right Knee - Pain    HPI: [name] is a 59 y.o. male who presents to the office reporting right knee pain.  Here for second opinion regarding causes and treatment options for this knee pain.  Patient underwent total knee replacement in January 2025.  Was back to work at about 8 weeks.  Has had pain and swelling in the knee since that time.  Also describes a lot of popping with walking in the knee.  Has had extensive infection workup which has been negative thus far.  Has had knee drainage as well but the fluid does come back.  Blood work has not been suggestive of infection.  Has had an MRI scan which was not particularly diagnostic.  Localizes pain to the [place] tubercle region..                ROS: All systems reviewed are negative as they relate to the chief complaint within the history of present illness.  Patient denies fevers or chills.  Assessment & Plan: Visit Diagnoses:  1. History of total right knee replacement     Plan: Impression is right knee pain.  I think there are 3 possibilities for pain generators in the knee.  1 would be mid flexion instability.  Does seem to have a fair amount of anterior posterior laxity as well as side-to-side laxity with suspended laxity evaluation with the knee flexed at 90 degrees.  That may require treatment with a larger spacer.  I think that could help some of the flexion instability but may end up causing a slight flexion contracture.  Another potential pain generator would be iliotibial band bursitis.  He does have tenderness over Gertie's tubercle but also some mild tenderness over the lateral epicondyle.  Radiographically there is a  prominence there which is morphologically atypical for the normal contour of the lateral femoral condyle.  That something that could be addressed at the time of revision surgery should it come to that.  Third potential pain generator and effusion generator could be from the patella.  There are some atypical lucencies at the prosthetic bone interface seen on the merchant and lateral radiographic views.  If any type of surgical revision is undertaking and particular care should be directed towards assessing stability of that patellar implant.  The other possibility is that this could represent a low-grade infection which is not evident on current laboratory and aspiration studies.  Last possibility is that the knee is painful and has swelling and we may not be able to find a definitive cause for that.  Follow-Up Instructions: No follow-ups on file.   Orders:  Orders Placed This Encounter  Procedures   XR Knee 1-2 Views Right   No orders of the defined types were placed in this encounter.     Procedures: No procedures performed   Clinical Data: No additional findings.  Objective: Vital Signs: There were no vitals taken for this visit.  Physical Exam:  Constitutional: Patient appears well-developed HEENT:  Head: Normocephalic Eyes:EOM are normal Neck: Normal range of motion Cardiovascular: Normal rate  Pulmonary/chest: Effort normal Neurologic: Patient is alert Skin: Skin is warm Psychiatric: Patient has normal mood and affect  Ortho Exam: Ortho exam demonstrates normal gait and alignment.  Has excellent range of motion of the right knee from 0-1 25 of flexion.  Does have slight increase in laxity to varus and valgus stress at 30 degrees.  When I suspend his knee at 90 degrees of flexion he definitely has increased anterior posterior laxity as well as varus valgus laxity in the knee.  No skin temperature differences or color changes right knee versus left knee.  Extensor mechanism is  intact.  There is no patellar apprehension or maltracking on exam.  He does have tenderness at the iliotibial band attachment onto Gertie's tubercle.  Op note is reviewed and there was no lateral release performed.  Overall the alignment and sizing the implants is excellent  Specialty Comments:  No specialty comments available.  Imaging: XR Knee 1-2 Views Right Result Date: 02/05/2024 Merchant view of right knee demonstrates patella well centered within the trochlea of the total knee replacement.  Lucency is noted at the implant polyethylene interface which is also present on the lateral view from a week ago.    PMFS History: Patient Active Problem List   Diagnosis Date Noted   History of total knee arthroplasty, right 01/23/2023   Pulmonary embolism (HCC) 12/12/2022   Primary osteoarthritis of right knee 10/25/2022   Migraine without aura and without status migrainosus, not intractable 12/12/2016   Candidal skin infection 06/03/2016   Chronic right shoulder pain 04/23/2016   Left shoulder pain 06/18/2015   Essential hypertension 04/29/2015   Migraine 04/29/2015   Hyperlipidemia 04/29/2015   Controlled type 2 diabetes mellitus without complication, without long-term current use of insulin  (HCC) 04/29/2015   Trigger thumb 04/29/2015   Hypospadias 01/14/2013   [place] 01/14/2013   Chronic urethral stricture 12/05/2010   Past Medical History:  Diagnosis Date   Anemia    Chronic urethral stricture HX DILATION    Complication of anesthesia    woke up during neck fusion   Degenerative arthritis of right knee    DM (diabetes mellitus), type 2 (HCC)    GERD (gastroesophageal reflux disease)    Headache, classical migraine    History of kidney stones    History of pulmonary embolus (PE) POST SURG. 2008   Hyperlipidemia    Hypertension    Hypospadias    Seasonal allergies     Family History  Problem Relation Age of Onset   Lung cancer Mother    COPD Mother    Rheum arthritis  Mother    Colon cancer Neg Hx     Past Surgical History:  Procedure Laterality Date   BALLOON DILATION  06/20/2011   Procedure: BALLOON DILATION;  Surgeon: Ricardo Likens, MD;  Location: [facility];  Service: Urology;  Laterality: Left;   CERVICAL FUSION  2004   C4 - 7   CYSTO/ RETROGRADE URETHROGRAM/ BALLOON DILATION OF URETHRAL STRICTURE  X5 LAST ONE 12-05-2011   FEB 2011/  2009 /  2008   CYSTOSCOPY WITH LITHOLAPAXY N/A 08/12/2012   Procedure: CYSTOSCOPY WITH LITHOLAPAXY;  Surgeon: Garnette Shack, MD;  Location: [facility];  Service: Urology;  Laterality: N/A;   CYSTOSCOPY WITH URETHRAL DILATATION  05/16/2011   Procedure: CYSTOSCOPY WITH URETHRAL DILATATION;  Surgeon: Glendia DELENA Elizabeth, MD;  Location: [facility];  Service: Urology;  Laterality: N/A;  BALLOON DILATION AND RETROGRADE    CYSTOSCOPY WITH URETHRAL  DILATATION  12/30/2011   Procedure: CYSTOSCOPY WITH URETHRAL DILATATION;  Surgeon: Glendia DELENA Elizabeth, MD;  Location: [facility];  Service: Urology;  Laterality: N/A;  CYSTO, BALLOON DILATION, RETROGRADE URETHROGRAM   CYSTOSCOPY WITH URETHRAL DILATATION N/A 05/06/2012   Procedure: CYSTOSCOPY WITH BALLOON DILATATION;  Surgeon: Glendia DELENA Elizabeth, MD;  Location: [facility];  Service: Urology;  Laterality: N/A;   CYSTOSCOPY WITH URETHRAL DILATATION N/A 08/12/2012   Procedure: CYSTOSCOPY WITH URETHRAL DILATATION  CYSTOSCOPY WITH BALLOON DILATION OF URETHRAL STRICTURE;  Surgeon: Garnette Shack, MD;  Location: [facility];  Service: Urology;  Laterality: N/A;   CYSTOSCOPY WITH URETHRAL DILATATION N/A 10/12/2012   Procedure: CYSTOSCOPY WITH URETHRAL DILATATION;  Surgeon: Ricardo Likens, MD;  Location: [facility];  Service: Urology;  Laterality: N/A;  cysto, retrograde, urethrogram, balloon dilation of urethreal stricture   CYSTOSCOPY/RETROGRADE/URETEROSCOPY/STONE EXTRACTION WITH BASKET  06/20/2011   Procedure: CYSTOSCOPY/RETROGRADE/URETEROSCOPY/STONE EXTRACTION  WITH BASKET;  Surgeon: Ricardo Likens, MD;  Location: [facility];  Service: Urology;  Laterality: Left;   HYPOSPADIAS CORRECTION     Surgery for hypospadias as child   Hypospadius repair  child   INGUINAL HERNIA REPAIR  FEB 2011   LEFT   KNEE ARTHROPLASTY Right 01/23/2023   Procedure: COMPUTER ASSISTED TOTAL KNEE ARTHROPLASTY;  Surgeon: Mardee Lynwood SQUIBB, MD;  Location: [facility];  Service: Orthopedics;  Laterality: Right;   ORIF ELBOW FRACTURE     x2   Social History   Occupational History   Not on file  Tobacco Use   Smoking status: Never    Passive exposure: Yes   Smokeless tobacco: Never  Vaping Use   Vaping status: Never Used  Substance and Sexual Activity   Alcohol use: Yes    Alcohol/week: 0.0 standard drinks of alcohol    Comment: OCCASIONAL   Drug use: No   Sexual activity: Not on file        "
